# Patient Record
Sex: Male | Born: 1965
Health system: Southern US, Community
[De-identification: ages and names within clinical notes are randomized; demographics above are authoritative.]

## PROBLEM LIST (undated history)

## (undated) DIAGNOSIS — I4891 Unspecified atrial fibrillation: Secondary | ICD-10-CM

## (undated) HISTORY — DX: Unspecified atrial fibrillation: I48.91

---
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
## 1990-12-25 HISTORY — PX: HERNIA REPAIR: SHX51

## 2000-09-10 ENCOUNTER — Emergency Department (HOSPITAL_COMMUNITY): Admission: EM | Admit: 2000-09-10 | Discharge: 2000-09-11 | Payer: Self-pay

## 2006-03-28 ENCOUNTER — Emergency Department (HOSPITAL_COMMUNITY): Admission: EM | Admit: 2006-03-28 | Discharge: 2006-03-29 | Payer: Self-pay | Admitting: Emergency Medicine

## 2009-09-11 ENCOUNTER — Emergency Department (HOSPITAL_COMMUNITY): Admission: EM | Admit: 2009-09-11 | Discharge: 2009-09-11 | Payer: Self-pay | Admitting: Emergency Medicine

## 2010-06-24 ENCOUNTER — Emergency Department (HOSPITAL_COMMUNITY): Admission: EM | Admit: 2010-06-24 | Discharge: 2010-06-24 | Payer: Self-pay | Admitting: Emergency Medicine

## 2010-12-13 ENCOUNTER — Ambulatory Visit (HOSPITAL_COMMUNITY)
Admission: RE | Admit: 2010-12-13 | Discharge: 2010-12-13 | Payer: Self-pay | Source: Home / Self Care | Attending: Occupational Medicine | Admitting: Occupational Medicine

## 2011-06-28 ENCOUNTER — Emergency Department (HOSPITAL_COMMUNITY)
Admission: EM | Admit: 2011-06-28 | Discharge: 2011-06-28 | Disposition: A | Discharge status: Home / Self Care | Payer: Self-pay | Attending: Emergency Medicine | Admitting: Emergency Medicine

## 2011-06-28 DIAGNOSIS — K047 Periapical abscess without sinus: Secondary | ICD-10-CM | POA: Insufficient documentation

## 2011-06-28 DIAGNOSIS — K089 Disorder of teeth and supporting structures, unspecified: Secondary | ICD-10-CM | POA: Insufficient documentation

## 2011-06-28 DIAGNOSIS — K006 Disturbances in tooth eruption: Secondary | ICD-10-CM | POA: Insufficient documentation

## 2012-05-10 ENCOUNTER — Emergency Department (INDEPENDENT_AMBULATORY_CARE_PROVIDER_SITE_OTHER)
Admission: EM | Admit: 2012-05-10 | Discharge: 2012-05-10 | Disposition: A | Discharge status: Home / Self Care | Payer: Self-pay | Source: Home / Self Care | Attending: Family Medicine | Admitting: Family Medicine

## 2012-05-10 ENCOUNTER — Encounter (HOSPITAL_COMMUNITY): Payer: Self-pay

## 2012-05-10 ENCOUNTER — Emergency Department (INDEPENDENT_AMBULATORY_CARE_PROVIDER_SITE_OTHER): Payer: Self-pay

## 2012-05-10 DIAGNOSIS — M25572 Pain in left ankle and joints of left foot: Secondary | ICD-10-CM

## 2012-05-10 DIAGNOSIS — M25579 Pain in unspecified ankle and joints of unspecified foot: Secondary | ICD-10-CM

## 2012-05-10 MED ORDER — IBUPROFEN 600 MG PO TABS: 600.0000 mg | ORAL_TABLET | Freq: Three times a day (TID) | ORAL | Status: AC

## 2012-05-10 MED ORDER — TRAMADOL HCL 50 MG PO TABS: 50.0000 mg | ORAL_TABLET | Freq: Four times a day (QID) | ORAL | Status: AC | PRN

## 2012-05-10 NOTE — Discharge Instructions (Signed)
There is no evidence of bone injury or fractures in your x-rays.  Take the prescribed medications as instructed be aware that tramadol can make you drowsy and he should not drive after taking this medication. Start rehabilitation exercises as soon as pain improves follow handout start slowly and increase up to 3 times daily at least 10 repetitions each exercise. Followup with the orthopedic Dr. (number provided above) if worsening symptoms or if persistent pain after completed treatment.

## 2012-05-10 NOTE — ED Notes (Signed)
Denies injury; has been having pain in left lateral  ankle for several weeks, tender to palpation; no reported relief w motrin 800mg 

## 2012-05-10 NOTE — ED Notes (Signed)
Vitals obtained by [place].

## 2012-05-13 NOTE — ED Provider Notes (Signed)
History     CSN: [id number]  Arrival date & time 05/10/12  1954   First MD Initiated Contact with Patient 05/10/12 1959      Chief Complaint  Patient presents with  . Ankle Pain    (Consider location/radiation/quality/duration/timing/severity/associated sxs/prior treatment) HPI Comments: 46 y/o obese male comes c/o left ankle pain for 4 weeks. States he sprained his ankle about 1 month ago while walking. Did not seek medical attention as appeared minimal injury with no swelling or bruising, but pain has persisted and worsened with time. 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 Reports pain with walking with limping and with touching the area also reports pain while laying in bed.    History reviewed. No pertinent past medical history.  History reviewed. No pertinent past surgical history.  History reviewed. No pertinent family history.  History  Substance Use Topics  . Smoking status: Not on file  . Smokeless tobacco: Not on file  . Alcohol Use: Not on file      Review of Systems  Constitutional:       10 systems reviewed: pertinent negative and positive symptoms as per HPI.     All other systems reviewed and are negative.    Allergies  Review of patient's allergies indicates not on file.  Home Medications   Current Outpatient Rx  Name Route Sig Dispense Refill  . IBUPROFEN 600 MG PO TABS Oral Take 1 tablet (600 mg total) by mouth 3 (three) times daily. 20 tablet 0  . TRAMADOL HCL 50 MG PO TABS Oral Take 1 tablet (50 mg total) by mouth every 6 (six) hours as needed for pain. 15 tablet 0    BP 100/69  Pulse 75  Temp(Src) 98.4 F (36.9 C) (Oral)  Resp 14  SpO2 98%  Physical Exam  Nursing note and vitals reviewed. Constitutional: He is oriented to person, place, and time. He appears well-developed and well-nourished. No distress.       Large individual also obese.   HENT:  Head: Normocephalic and atraumatic.    Neck: No JVD present. No thyromegaly present.  Cardiovascular: Normal heart sounds.   Pulmonary/Chest: Breath sounds normal.  Musculoskeletal:       Left ankle: Large bilateral ankles. No obvious swelling, redness or deformity. Tenderness to palpation inferior and anterior to lateral malleoli. No pitting edema. No bruising. No laxity. Also reported tenderness by palpating heel pad.  Patient able to bear weight in left foot but reported discomfort. Intact sensation , dorsal pedi and tibial posterior pulses.  Neurological: He is alert and oriented to person, place, and time.  Skin: No rash noted.    ED Course  Procedures (including critical care time)  Labs Reviewed - No data to display No results found.   1. Ankle pain, left       MDM  No abnormal findings on Xrays. Treated symptomatically with ankle brace. Ibuprofen and tramadol. Follow up with orthopedist as needed.        Sharin Grave, MD 05/13/12 1334

## 2012-11-13 ENCOUNTER — Ambulatory Visit (INDEPENDENT_AMBULATORY_CARE_PROVIDER_SITE_OTHER): Payer: BC Managed Care – PPO | Admitting: Emergency Medicine

## 2012-11-13 VITALS — BP 106/76 | HR 73 | Temp 98.2°F | Resp 16 | Ht 73.0 in | Wt 343.0 lb

## 2012-11-13 DIAGNOSIS — Z0289 Encounter for other administrative examinations: Secondary | ICD-10-CM

## 2012-11-13 DIAGNOSIS — Z0000 Encounter for general adult medical examination without abnormal findings: Secondary | ICD-10-CM

## 2012-11-13 DIAGNOSIS — E669 Obesity, unspecified: Secondary | ICD-10-CM

## 2012-11-13 LAB — POCT UA - MICROSCOPIC ONLY
Bacteria, U Microscopic: NEGATIVE
RBC, urine, microscopic: NEGATIVE
WBC, Ur, HPF, POC: NEGATIVE

## 2012-11-13 LAB — POCT URINALYSIS DIPSTICK
Bilirubin, UA: NEGATIVE
Glucose, UA: NEGATIVE
Leukocytes, UA: NEGATIVE
Nitrite, UA: NEGATIVE

## 2012-11-13 NOTE — Progress Notes (Signed)
[facility] [address] [place] [address] [phone]  Date:  11/13/2012   Name:  Shane Hart   DOB:  11-04-1966   MRN:  [id number]  PCP:  No primary provider on file.    Chief Complaint: Annual Exam   History of Present Illness:  Shane Hart is a 46 y.o. very pleasant male patient who presents with the following:  Requests a general physical.  Is not fasting.  Has no current healtcare concerns.  No medications.  No complaints.  Current immunizations.  Denies other complaint or health concern today.   There is no problem list on file for this patient.   History reviewed. No pertinent past medical history.  History reviewed. No pertinent past surgical history.  History  Substance Use Topics  . Smoking status: Never Smoker   . Smokeless tobacco: Not on file  . Alcohol Use: No    Family History  Problem Relation Age of Onset  . Diabetes Mother     No Known Allergies  Medication list has been reviewed and updated.  No current outpatient prescriptions on file prior to visit.    Review of Systems:  As per HPI, otherwise negative.    Physical Examination: Filed Vitals:   11/13/12 1234  BP: 106/76  Pulse: 73  Temp: 98.2 F (36.8 C)  Resp: 16   Filed Vitals:   11/13/12 1234  Height: 6\' 1"  (1.854 m)  Weight: 343 lb (155.584 kg)   Body mass index is 45.25 kg/(m^2). Ideal Body Weight: Weight in (lb) to have BMI = 25: 189.1   GEN: obese, NAD, Non-toxic, A & O x 3  No shortness of breath, sepsis or icterus no rash. HEENT: Atraumatic, Normocephalic. Neck supple. No masses, No LAD.  Oropharynx negative Ears and Nose: No external deformity.  TM negative CV: RRR, No M/G/R. No JVD. No thrill. No extra heart sounds. PULM: CTA B, no wheezes, crackles, rhonchi. No retractions. No resp. distress. No accessory muscle use. ABD: S, NT, ND, +BS. No rebound. No HSM. EXTR: No c/c/e NEURO Normal gait.  PSYCH: Normally interactive.  Conversant. Not depressed or anxious appearing.  Calm demeanor.    Assessment and Plan: Preventive health exam Labs Follow up after labs Recommended weight loss  Carmelina Dane, MD

## 2012-11-14 ENCOUNTER — Encounter (INDEPENDENT_AMBULATORY_CARE_PROVIDER_SITE_OTHER): Payer: BC Managed Care – PPO | Admitting: Emergency Medicine

## 2012-11-14 VITALS — BP 121/73 | HR 62

## 2012-11-14 DIAGNOSIS — Z0289 Encounter for other administrative examinations: Secondary | ICD-10-CM

## 2012-11-14 DIAGNOSIS — Z0000 Encounter for general adult medical examination without abnormal findings: Secondary | ICD-10-CM

## 2012-11-14 DIAGNOSIS — E669 Obesity, unspecified: Secondary | ICD-10-CM

## 2012-11-14 LAB — POCT CBC
HCT, POC: 42.8 % — AB (ref 43.5–53.7)
Lymph, poc: 2.1 (ref 0.6–3.4)
MCHC: 30.4 g/dL — AB (ref 31.8–35.4)
MID (cbc): 0.2 (ref 0–0.9)
MPV: 8.3 fL (ref 0–99.8)
POC Granulocyte: 2.7 (ref 2–6.9)
POC LYMPH PERCENT: 41.6 %L (ref 10–50)
POC MID %: 4.8 %M (ref 0–12)
RDW, POC: 12.9 %

## 2012-11-14 LAB — LIPID PANEL
LDL Cholesterol: 33 mg/dL (ref 0–99)
Total CHOL/HDL Ratio: 2.2 Ratio
VLDL: 19 mg/dL (ref 0–40)

## 2012-11-14 LAB — COMPREHENSIVE METABOLIC PANEL
Alkaline Phosphatase: 62 U/L (ref 39–117)
Glucose, Bld: 97 mg/dL (ref 70–99)
Sodium: 136 mEq/L (ref 135–145)
Total Bilirubin: 0.8 mg/dL (ref 0.3–1.2)
Total Protein: 7.2 g/dL (ref 6.0–8.3)

## 2012-11-14 LAB — TSH: TSH: 1.292 u[IU]/mL (ref 0.350–4.500)

## 2012-11-14 LAB — PSA: PSA: 1.29 ng/mL (ref <=4.00)

## 2012-11-15 LAB — VITAMIN D 25 HYDROXY (VIT D DEFICIENCY, FRACTURES): Vit D, 25-Hydroxy: 26 ng/mL — ABNORMAL LOW (ref 30–89)

## 2012-11-15 MED ORDER — ERGOCALCIFEROL 1.25 MG (50000 UT) PO CAPS: 50000.0000 [IU] | ORAL_CAPSULE | ORAL | Status: DC

## 2012-11-24 NOTE — Progress Notes (Signed)
Reviewed and agree.

## 2013-01-03 ENCOUNTER — Emergency Department (INDEPENDENT_AMBULATORY_CARE_PROVIDER_SITE_OTHER)
Admission: EM | Admit: 2013-01-03 | Discharge: 2013-01-03 | Disposition: A | Discharge status: Home / Self Care | Payer: BC Managed Care – PPO | Source: Home / Self Care | Attending: Family Medicine | Admitting: Family Medicine

## 2013-01-03 ENCOUNTER — Encounter (HOSPITAL_COMMUNITY): Payer: Self-pay | Admitting: *Deleted

## 2013-01-03 DIAGNOSIS — J329 Chronic sinusitis, unspecified: Secondary | ICD-10-CM

## 2013-01-03 MED ORDER — AZITHROMYCIN 250 MG PO TABS: ORAL_TABLET | ORAL | Status: DC

## 2013-01-03 MED ORDER — SALINE NASAL SPRAY 0.65 % NA SOLN: 1.0000 | NASAL | Status: DC | PRN

## 2013-01-03 NOTE — ED Provider Notes (Signed)
History     CSN: [id number]  Arrival date & time 01/03/13  1344   First MD Initiated Contact with Patient 01/03/13 1520      Chief Complaint  Patient presents with  . Facial Pain    (Consider location/radiation/quality/duration/timing/severity/associated sxs/prior treatment) Patient is a 47 y.o. male presenting with URI. The history is provided by the patient.  URI The primary symptoms include headaches. The current episode started 6 to 7 days ago. This is a new problem. The problem has not changed since onset. The onset of the illness is associated with exposure to sick contacts. Symptoms associated with the illness include facial pain, sinus pressure, congestion and rhinorrhea. The following treatments were addressed: Acetaminophen was effective (Mucinex).  Reports blood tinged nasal discharge  History reviewed. No pertinent past medical history.  History reviewed. No pertinent past surgical history.  Family History  Problem Relation Age of Onset  . Diabetes Mother     History  Substance Use Topics  . Smoking status: Never Smoker   . Smokeless tobacco: Not on file  . Alcohol Use: No      Review of Systems  HENT: Positive for congestion, rhinorrhea and sinus pressure.   Neurological: Positive for headaches.  All other systems reviewed and are negative.    Allergies  Review of patient's allergies indicates no known allergies.  Home Medications   Current Outpatient Rx  Name  Route  Sig  Dispense  Refill  . AZITHROMYCIN 250 MG PO TABS      Azithromycin 500mg  on day 1, then 250mg  on days 2-4   6 tablet   0   . ERGOCALCIFEROL 50000 UNITS PO CAPS   Oral   Take 1 capsule (50,000 Units total) by mouth once a week.   4 capsule   12   . SALINE NASAL SPRAY 0.65 % NA SOLN   Nasal   Place 1 spray into the nose as needed for congestion.   30 mL   12     BP 119/79  Pulse 67  Temp 97.8 F (36.6 C) (Oral)  Resp 22  SpO2 99%  Physical Exam  Nursing note  and vitals reviewed. Constitutional: He is oriented to person, place, and time. Vital signs are normal. He appears well-developed and well-nourished. He is active and cooperative.  HENT:  Head: Normocephalic.  Right Ear: External ear normal. A middle ear effusion is present.  Left Ear: Tympanic membrane and external ear normal.  Nose: Right sinus exhibits maxillary sinus tenderness and frontal sinus tenderness. Left sinus exhibits maxillary sinus tenderness and frontal sinus tenderness.  Mouth/Throat: Uvula is midline, oropharynx is clear and moist and mucous membranes are normal.  Eyes: Conjunctivae normal are normal. Pupils are equal, round, and reactive to light. No scleral icterus.  Neck: Trachea normal and normal range of motion. Neck supple.  Cardiovascular: Normal rate, regular rhythm, normal heart sounds, intact distal pulses and normal pulses.   Pulmonary/Chest: Effort normal and breath sounds normal.  Lymphadenopathy:       Head (right side): No submental, no submandibular, no tonsillar, no preauricular, no posterior auricular and no occipital adenopathy present.       Head (left side): No submental, no submandibular, no tonsillar, no preauricular, no posterior auricular and no occipital adenopathy present.    He has no cervical adenopathy.  Neurological: He is alert and oriented to person, place, and time. No cranial nerve deficit or sensory deficit.  Skin: Skin is warm and dry.  Psychiatric:  He has a normal mood and affect. His speech is normal and behavior is normal. Judgment and thought content normal. Cognition and memory are normal.    ED Course  Procedures (including critical care time)  Labs Reviewed - No data to display No results found.   1. Sinusitis       MDM  Increase fluid intake, rest.  Begin Azithromycin.  Begin saline nasal spray and/or saline irrigation. Antihistamines of your choice (Claritin or Zyrtec).  Tylenol or Motrin for fever/discomfort.  Followup  with PCP if not improving 5 to 7 days.         Johnsie Kindred, NP 01/03/13 1534

## 2013-01-03 NOTE — ED Notes (Signed)
Pt reports facial pain and congestion with drainage for the past 6 days with no relief from otc meds.. Denies fever ,body aches, or other symptoms.

## 2013-01-10 NOTE — ED Provider Notes (Signed)
Medical screening examination/treatment/procedure(s) were performed by resident physician or non-physician practitioner and as supervising physician I was immediately available for consultation/collaboration.   Jeran Hiltz DOUGLAS MD.    Jaritza Duignan D Monish Haliburton, MD 01/10/13 1759 

## 2013-03-30 ENCOUNTER — Ambulatory Visit (INDEPENDENT_AMBULATORY_CARE_PROVIDER_SITE_OTHER): Payer: BC Managed Care – PPO | Admitting: Physician Assistant

## 2013-03-30 VITALS — BP 106/72 | HR 58 | Temp 97.9°F | Resp 16 | Ht 73.0 in | Wt 341.8 lb

## 2013-03-30 DIAGNOSIS — J309 Allergic rhinitis, unspecified: Secondary | ICD-10-CM

## 2013-03-30 DIAGNOSIS — J329 Chronic sinusitis, unspecified: Secondary | ICD-10-CM

## 2013-03-30 MED ORDER — AMOXICILLIN-POT CLAVULANATE 875-125 MG PO TABS: 1.0000 | ORAL_TABLET | Freq: Two times a day (BID) | ORAL | Status: DC

## 2013-03-30 MED ORDER — FLUTICASONE PROPIONATE 50 MCG/ACT NA SUSP: 2.0000 | Freq: Every day | NASAL | Status: DC

## 2013-03-30 NOTE — Progress Notes (Signed)
  Subjective:    Patient ID: Shane Hart, male    DOB: 01-07-1966, 47 y.o.   MRN: [id number]  HPI   Shane Hart is a pleasant 47 yr old male here with concern for sinusitis.  States he is feeling a lot of sinus pressure, nasal drainage that is blood tinged.  This has been present for about 5 days.  OTC meds not helping.  Also complains of itchy watery eyes.  Endorse facial pain and upper tooth pain.  Denies env allergies.  Denies sore throat, ear pain, cough.  Denies fever, chills, body aches, muscle aches.  Denies sick contacts.    Review of Systems  Constitutional: Negative for fever and chills.  HENT: Positive for congestion, rhinorrhea and sinus pressure. Negative for ear pain and sore throat.   Respiratory: Negative for cough, shortness of breath and wheezing.   Cardiovascular: Negative.   Gastrointestinal: Negative.   Musculoskeletal: Negative.   Neurological: Negative.        Objective:   Physical Exam  Vitals reviewed. Constitutional: He is oriented to person, place, and time. He appears well-developed and well-nourished. No distress.  HENT:  Head: Normocephalic and atraumatic.  Right Ear: Tympanic membrane and ear canal normal.  Left Ear: Tympanic membrane and ear canal normal.  Nose: Mucosal edema and rhinorrhea present. Right sinus exhibits maxillary sinus tenderness. Right sinus exhibits no frontal sinus tenderness. Left sinus exhibits maxillary sinus tenderness. Left sinus exhibits no frontal sinus tenderness.  Mouth/Throat: Uvula is midline, oropharynx is clear and moist and mucous membranes are normal.  Neck: Neck supple.  Cardiovascular: Normal rate, regular rhythm and normal heart sounds.  Exam reveals no gallop and no friction rub.   No murmur heard. Pulmonary/Chest: Effort normal and breath sounds normal. He has no wheezes. He has no rales.  Lymphadenopathy:    He has no cervical adenopathy.  Neurological: He is alert and oriented to person, place, and time.  Skin:  Skin is warm and dry.  Psychiatric: He has a normal mood and affect. His behavior is normal.     Filed Vitals:   03/30/13 1125  BP: 106/72  Pulse: 58  Temp: 97.9 F (36.6 C)  Resp: 16        Assessment & Plan:  Sinusitis - Plan: fluticasone (FLONASE) 50 MCG/ACT nasal spray, amoxicillin-clavulanate (AUGMENTIN) 875-125 MG per tablet  -- Pt with 5 days of facial pain/pressure, upper tooth pain.  Maxillary sinus tenderness on exam.  I think it's reasonable to treat with abx.  Will start amox/clav x 10 days.  Encouraged pt to finish full course.  Push fluids.    Allergic rhinitis - Plan: fluticasone (FLONASE) 50 MCG/ACT nasal spray  -- I think there may be an allergic component to his symptoms.  Will start Flonase and otc antihistamine.  Discussed with pt that getting control of allergies may prevent the development of sinus infections in the future.  Discussed RTC precautions, pt understands and is in agreement with this plan.

## 2013-03-30 NOTE — Patient Instructions (Addendum)
Begin taking the antibiotic as directed.  Be sure to finish the full course.  Take with food to reduce stomach upset.  Plenty of fluids (water is best!)  Begin using the Flonase nasal spray, this will help with nasal congestion and help control allergy symptoms.  Also begin taking an allergy medicine by mouth (Claritin/Allegra/Zyrtec) - this will also help with nasal symptoms and itchy eyes.  If any symptoms are worsening or not improving, please let us know.   Sinusitis Sinusitis is redness, soreness, and swelling (inflammation) of the paranasal sinuses. Paranasal sinuses are air pockets within the bones of your face (beneath the eyes, the middle of the forehead, or above the eyes). In healthy paranasal sinuses, mucus is able to drain out, and air is able to circulate through them by way of your nose. However, when your paranasal sinuses are inflamed, mucus and air can become trapped. This can allow bacteria and other germs to grow and cause infection. Sinusitis can develop quickly and last only a short time (acute) or continue over a long period (chronic). Sinusitis that lasts for more than 12 weeks is considered chronic.  CAUSES  Causes of sinusitis include:  Allergies.  Structural abnormalities, such as displacement of the cartilage that separates your nostrils (deviated septum), which can decrease the air flow through your nose and sinuses and affect sinus drainage.  Functional abnormalities, such as when the small hairs (cilia) that line your sinuses and help remove mucus do not work properly or are not present. SYMPTOMS  Symptoms of acute and chronic sinusitis are the same. The primary symptoms are pain and pressure around the affected sinuses. Other symptoms include:  Upper toothache.  Earache.  Headache.  Bad breath.  Decreased sense of smell and taste.  A cough, which worsens when you are lying flat.  Fatigue.  Fever.  Thick drainage from your nose, which often is green and  may contain pus (purulent).  Swelling and warmth over the affected sinuses. DIAGNOSIS  Your caregiver will perform a physical exam. During the exam, your caregiver may:  Look in your nose for signs of abnormal growths in your nostrils (nasal polyps).  Tap over the affected sinus to check for signs of infection.  View the inside of your sinuses (endoscopy) with a special imaging device with a light attached (endoscope), which is inserted into your sinuses. If your caregiver suspects that you have chronic sinusitis, one or more of the following tests may be recommended:  Allergy tests.  Nasal culture A sample of mucus is taken from your nose and sent to a lab and screened for bacteria.  Nasal cytology A sample of mucus is taken from your nose and examined by your caregiver to determine if your sinusitis is related to an allergy. TREATMENT  Most cases of acute sinusitis are related to a viral infection and will resolve on their own within 10 days. Sometimes medicines are prescribed to help relieve symptoms (pain medicine, decongestants, nasal steroid sprays, or saline sprays).  However, for sinusitis related to a bacterial infection, your caregiver will prescribe antibiotic medicines. These are medicines that will help kill the bacteria causing the infection.  Rarely, sinusitis is caused by a fungal infection. In theses cases, your caregiver will prescribe antifungal medicine. For some cases of chronic sinusitis, surgery is needed. Generally, these are cases in which sinusitis recurs more than 3 times per year, despite other treatments. HOME CARE INSTRUCTIONS   Drink plenty of water. Water helps thin the mucus  so your sinuses can drain more easily.  Use a humidifier.  Inhale steam 3 to 4 times a day (for example, sit in the bathroom with the shower running).  Apply a warm, moist washcloth to your face 3 to 4 times a day, or as directed by your caregiver.  Use saline nasal sprays to help  moisten and clean your sinuses.  Take over-the-counter or prescription medicines for pain, discomfort, or fever only as directed by your caregiver. SEEK IMMEDIATE MEDICAL CARE IF:  You have increasing pain or severe headaches.  You have nausea, vomiting, or drowsiness.  You have swelling around your face.  You have vision problems.  You have a stiff neck.  You have difficulty breathing. MAKE SURE YOU:   Understand these instructions.  Will watch your condition.  Will get help right away if you are not doing well or get worse. Document Released: 12/11/2005 Document Revised: 03/04/2012 Document Reviewed: 12/26/2011 [facility] Patient Information 2013 [place], [place].

## 2013-03-31 ENCOUNTER — Telehealth: Payer: Self-pay

## 2013-03-31 ENCOUNTER — Ambulatory Visit (INDEPENDENT_AMBULATORY_CARE_PROVIDER_SITE_OTHER): Payer: BC Managed Care – PPO | Admitting: Emergency Medicine

## 2013-03-31 VITALS — BP 101/70 | HR 53 | Temp 97.7°F | Resp 18 | Ht 73.0 in | Wt 343.0 lb

## 2013-03-31 DIAGNOSIS — J018 Other acute sinusitis: Secondary | ICD-10-CM

## 2013-03-31 MED ORDER — HYDROCODONE-ACETAMINOPHEN 5-325 MG PO TABS: 1.0000 | ORAL_TABLET | ORAL | Status: DC | PRN

## 2013-03-31 MED ORDER — PSEUDOEPHEDRINE-GUAIFENESIN ER 60-600 MG PO TB12: 1.0000 | ORAL_TABLET | Freq: Two times a day (BID) | ORAL | Status: DC

## 2013-03-31 NOTE — Telephone Encounter (Signed)
Traditionally, ibuprofen &/or acetaminophen is the recommended treatment for the HA associated with sinusitis.

## 2013-03-31 NOTE — Progress Notes (Signed)
[facility] [address] [place] [address] [phone]  Date:  03/31/2013   Name:  Shane Hart   DOB:  May 17, 1966   MRN:  [id number]  PCP:  No primary provider on file.    Chief Complaint: Headache and Sinusitis   History of Present Illness:  Shane Hart is a 47 y.o. very pleasant male patient who presents with the following:  Ill past six days with sinus complaints.  Seen yesterday and put on augmentin and flonase.  Now returns today with increased pain that has not been relieved by course of medications.  Denies fever or chills.  Has purulent nasal drainage that is frequently blood tinged.  No nausea or vomiting.  No cough, wheezing or shortness of breath.  No improvement with over the counter medications or other home remedies. Denies other complaint or health concern today.   There is no problem list on file for this patient.   History reviewed. No pertinent past medical history.  History reviewed. No pertinent past surgical history.  History  Substance Use Topics  . Smoking status: Never Smoker   . Smokeless tobacco: Not on file  . Alcohol Use: No    Family History  Problem Relation Age of Onset  . Hypertension Mother   . Hypertension Sister     No Known Allergies  Medication list has been reviewed and updated.  Current Outpatient Prescriptions on File Prior to Visit  Medication Sig Dispense Refill  . amoxicillin-clavulanate (AUGMENTIN) 875-125 MG per tablet Take 1 tablet by mouth 2 (two) times daily.  20 tablet  0  . fluticasone (FLONASE) 50 MCG/ACT nasal spray Place 2 sprays into the nose daily.  16 g  3   No current facility-administered medications on file prior to visit.    Review of Systems:  As per HPI, otherwise negative.    Physical Examination: Filed Vitals:   03/31/13 1522  BP: 101/70  Pulse: 53  Temp: 97.7 F (36.5 C)  Resp: 18   Filed Vitals:   03/31/13 1522  Height: 6\' 1"  (1.854 m)  Weight: 343 lb  (155.584 kg)   Body mass index is 45.26 kg/(m^2). Ideal Body Weight: Weight in (lb) to have BMI = 25: 189.1  GEN: obese, NAD, Non-toxic, A & O x 3 HEENT: Atraumatic, Normocephalic. Neck supple. No masses, No LAD. Ears and Nose: No external deformity.  Tender maxillary sinuses bilaterally CV: RRR, No M/G/R. No JVD. No thrill. No extra heart sounds. PULM: CTA B, no wheezes, crackles, rhonchi. No retractions. No resp. distress. No accessory muscle use. ABD: S, NT, ND, +BS. No rebound. No HSM. EXTR: No c/c/e NEURO Normal gait.  PSYCH: Normally interactive. Conversant. Not depressed or anxious appearing.  Calm demeanor.    Assessment and Plan: Sinusitis mucinex d  vicodin Follow up as needed   Signed,  Phillips Odor, MD

## 2013-03-31 NOTE — Patient Instructions (Signed)

## 2013-03-31 NOTE — Telephone Encounter (Signed)
Patient was in yesterday for a sinus infection would like to know if he could have pain medication for the headaches he is getting from the infection please call him at [phone]

## 2013-03-31 NOTE — Telephone Encounter (Signed)
He states ibuprofen no relief, he has come back in b/c headache is worse.

## 2013-04-10 ENCOUNTER — Encounter (HOSPITAL_COMMUNITY): Payer: Self-pay | Admitting: Emergency Medicine

## 2013-04-10 ENCOUNTER — Emergency Department (HOSPITAL_COMMUNITY)
Admission: EM | Admit: 2013-04-10 | Discharge: 2013-04-10 | Disposition: A | Discharge status: Home / Self Care | Payer: BC Managed Care – PPO | Attending: Emergency Medicine | Admitting: Emergency Medicine

## 2013-04-10 ENCOUNTER — Emergency Department (HOSPITAL_COMMUNITY): Payer: BC Managed Care – PPO

## 2013-04-10 DIAGNOSIS — R0789 Other chest pain: Secondary | ICD-10-CM | POA: Insufficient documentation

## 2013-04-10 DIAGNOSIS — J329 Chronic sinusitis, unspecified: Secondary | ICD-10-CM | POA: Insufficient documentation

## 2013-04-10 DIAGNOSIS — J9801 Acute bronchospasm: Secondary | ICD-10-CM

## 2013-04-10 DIAGNOSIS — Z79899 Other long term (current) drug therapy: Secondary | ICD-10-CM | POA: Insufficient documentation

## 2013-04-10 DIAGNOSIS — R093 Abnormal sputum: Secondary | ICD-10-CM | POA: Insufficient documentation

## 2013-04-10 DIAGNOSIS — R0602 Shortness of breath: Secondary | ICD-10-CM | POA: Insufficient documentation

## 2013-04-10 DIAGNOSIS — J209 Acute bronchitis, unspecified: Secondary | ICD-10-CM | POA: Insufficient documentation

## 2013-04-10 DIAGNOSIS — J3489 Other specified disorders of nose and nasal sinuses: Secondary | ICD-10-CM | POA: Insufficient documentation

## 2013-04-10 DIAGNOSIS — J40 Bronchitis, not specified as acute or chronic: Secondary | ICD-10-CM

## 2013-04-10 MED ORDER — HYDROCODONE-ACETAMINOPHEN 5-325 MG PO TABS: 1.0000 | ORAL_TABLET | ORAL | Status: DC | PRN

## 2013-04-10 MED ORDER — ALBUTEROL SULFATE (5 MG/ML) 0.5% IN NEBU
5.0000 mg | INHALATION_SOLUTION | Freq: Once | RESPIRATORY_TRACT | Status: AC
  Administered 2013-04-10: 5 mg via RESPIRATORY_TRACT
  Filled 2013-04-10: qty 1

## 2013-04-10 MED ORDER — OXYCODONE-ACETAMINOPHEN 5-325 MG PO TABS
2.0000 | ORAL_TABLET | Freq: Once | ORAL | Status: AC
  Administered 2013-04-10: 2 via ORAL
  Filled 2013-04-10 (×2): qty 1

## 2013-04-10 MED ORDER — GUAIFENESIN-CODEINE 100-10 MG/5ML PO SYRP: 5.0000 mL | ORAL_SOLUTION | Freq: Three times a day (TID) | ORAL | Status: DC | PRN

## 2013-04-10 MED ORDER — IPRATROPIUM BROMIDE 0.02 % IN SOLN
0.5000 mg | Freq: Once | RESPIRATORY_TRACT | Status: AC
  Administered 2013-04-10: 0.5 mg via RESPIRATORY_TRACT
  Filled 2013-04-10: qty 2.5

## 2013-04-10 MED ORDER — ALBUTEROL SULFATE HFA 108 (90 BASE) MCG/ACT IN AERS
2.0000 | INHALATION_SPRAY | Freq: Four times a day (QID) | RESPIRATORY_TRACT | Status: DC
  Administered 2013-04-10: 2 via RESPIRATORY_TRACT
  Filled 2013-04-10: qty 6.7

## 2013-04-10 MED ORDER — LEVOFLOXACIN 500 MG PO TABS: 500.0000 mg | ORAL_TABLET | Freq: Every day | ORAL | Status: DC

## 2013-04-10 NOTE — ED Notes (Signed)
Pt's family states he has been coughing and having burning in his chest with cough  Pt states he has been to the dr this week and was put on antibiotics on Monday  Pt states he is not feeling better

## 2013-04-10 NOTE — ED Provider Notes (Signed)
History     CSN: [id number]  Arrival date & time 04/10/13  7829   First MD Initiated Contact with Patient 04/10/13 2048      Chief Complaint  Patient presents with  . Cough  . Shortness of Breath    (Consider location/radiation/quality/duration/timing/severity/associated sxs/prior treatment) HPI Comments: 47 year old male with no significant past medical history presents to the emergency department with his wife complaining of worsening cough and congestion x 2 weeks. Patient was seen at [facility] on 4/6 and prescribed flonase and augmentin for a sinus infection. He returned the next day due to increasing sinus pain and was given vicodin. Symptoms have not subsided and he is now experiencing chest tightness and burning with a cough with occasional production of green phlegm. Admits to associated cold sweats and chills. He completed the course of augmentin and has been using nasal spray. Admits to occasional sob and wheezing. No hx of asthma. Denies chest pain, nausea or vomiting. No sick contacts.  Patient is a 47 y.o. male presenting with cough and shortness of breath. The history is provided by the patient and the spouse.  Cough Associated symptoms: shortness of breath   Shortness of Breath Associated symptoms: cough     History reviewed. No pertinent past medical history.  Past Surgical History  Procedure Laterality Date  . Hernia repair    . Knee surgery      Family History  Problem Relation Age of Onset  . Hypertension Mother   . Hypertension Sister     History  Substance Use Topics  . Smoking status: Never Smoker   . Smokeless tobacco: Not on file  . Alcohol Use: No      Review of Systems  Respiratory: Positive for cough and shortness of breath.     Allergies  Review of patient's allergies indicates no known allergies.  Home Medications   Current Outpatient Rx  Name  Route  Sig  Dispense  Refill  . fluticasone (FLONASE) 50 MCG/ACT nasal  spray   Nasal   Place 2 sprays into the nose daily as needed for allergies.         Marland Kitchen HYDROcodone-acetaminophen (NORCO/VICODIN) 5-325 MG per tablet   Oral   Take 1 tablet by mouth every 4 (four) hours as needed for pain.         Marland Kitchen loratadine-pseudoephedrine (CLARITIN-D 24-HOUR) 10-240 MG per 24 hr tablet   Oral   Take 1 tablet by mouth daily.         Marland Kitchen amoxicillin-clavulanate (AUGMENTIN) 875-125 MG per tablet   Oral   Take 1 tablet by mouth 2 (two) times daily.           BP 128/69  Pulse 83  Temp(Src) 99.4 F (37.4 C) (Oral)  Resp 19  SpO2 97%  Physical Exam  Nursing note and vitals reviewed. Constitutional: He is oriented to person, place, and time. He appears well-developed. No distress.  Obese.  HENT:  Head: Normocephalic and atraumatic.  Nose: Mucosal edema present. Right sinus exhibits maxillary sinus tenderness and frontal sinus tenderness. Left sinus exhibits maxillary sinus tenderness and frontal sinus tenderness.  Mouth/Throat: Uvula is midline and mucous membranes are normal. Posterior oropharyngeal erythema present. No oropharyngeal exudate or posterior oropharyngeal edema.  Eyes: Conjunctivae are normal.  Neck: Normal range of motion. Neck supple.  Cardiovascular: Normal rate, regular rhythm and normal heart sounds.   Pulmonary/Chest: Effort normal. No accessory muscle usage. No respiratory distress. He has no decreased breath sounds.  He has wheezes (scattered end-expiratory). He has rhonchi (mild scattered). He has no rales.  Abdominal: Soft. Bowel sounds are normal. There is no tenderness.  Musculoskeletal: Normal range of motion. He exhibits no edema.  Lymphadenopathy:    He has no cervical adenopathy.  Neurological: He is alert and oriented to person, place, and time.  Skin: Skin is warm and dry.  Psychiatric: He has a normal mood and affect. His behavior is normal.    ED Course  Procedures (including critical care time)  Labs Reviewed - No  data to display Dg Chest 2 View  04/10/2013  *RADIOLOGY REPORT*  Clinical Data: Cough and shortness of breath.  CHEST - 2 VIEW  Comparison: Chest radiograph performed 03/28/2006  Findings: The lungs are well-aerated.  Mild vascular congestion is noted, with mildly increased interstitial markings; this could reflect minimal interstitial edema.  Mild pneumonia could conceivably have a similar appearance.  Small bilateral pleural effusions are noted.  No pneumothorax is seen.  The heart is normal in size; the mediastinal contour is within normal limits.  No acute osseous abnormalities are seen.  IMPRESSION: Mild vascular congestion, with mildly increased interstitial markings; this could reflect minimal interstitial edema.  Mild pneumonia could conceivably have a similar appearance.   Original Report Authenticated By: Tonia Ghent, M.D.      1. Bronchitis   2. Bronchospasm   3. Sinusitis       MDM  47 y/o male with worsening cough since being treated for sinus infection. End-expiratory wheezes heard on exam. Will give duo-neb. Marked mucosal edema present along with mild sinus tenderness. 9:48 PM CXR showing possible mild pneumonia with vascular congestion. Patient reports some improvement in symptoms with duo-neb. Wheezing with slight clinical improvement on re-examination. Rx levaquin, albuterol inhaler, guaifenesin for cough, and vicodin for pain. Return precautions discussed. Patient and wife state understanding of plan and are agreeable.        Trevor Mace, PA-C 04/10/13 2150

## 2013-04-10 NOTE — ED Provider Notes (Signed)
Medical screening examination/treatment/procedure(s) were performed by non-physician practitioner and as supervising physician I was immediately available for consultation/collaboration.  Kealy Lewter R. Jasmond River, MD 04/10/13 2336 

## 2013-06-05 NOTE — Progress Notes (Signed)
This encounter was created in error - please disregard.

## 2013-07-11 ENCOUNTER — Ambulatory Visit (INDEPENDENT_AMBULATORY_CARE_PROVIDER_SITE_OTHER): Payer: BC Managed Care – PPO | Admitting: Emergency Medicine

## 2013-07-11 VITALS — BP 132/82 | HR 91 | Temp 98.0°F | Resp 17 | Ht 72.0 in | Wt 332.0 lb

## 2013-07-11 DIAGNOSIS — J018 Other acute sinusitis: Secondary | ICD-10-CM

## 2013-07-11 DIAGNOSIS — J019 Acute sinusitis, unspecified: Secondary | ICD-10-CM

## 2013-07-11 MED ORDER — PSEUDOEPHEDRINE-GUAIFENESIN ER 60-600 MG PO TB12: 1.0000 | ORAL_TABLET | Freq: Two times a day (BID) | ORAL | Status: AC

## 2013-07-11 MED ORDER — AMOXICILLIN-POT CLAVULANATE 875-125 MG PO TABS: 1.0000 | ORAL_TABLET | Freq: Two times a day (BID) | ORAL | Status: DC

## 2013-07-11 NOTE — Patient Instructions (Addendum)

## 2013-07-11 NOTE — Progress Notes (Signed)
  Subjective:     Shane Hart is a 47 y.o. male who presents for evaluation of sinus pain. Symptoms include: congestion, facial pain, frequent clearing of the throat, headaches, nasal congestion, post nasal drip and sinus pressure. Onset of symptoms was 5 days ago. Symptoms have been gradually worsening since that time. Past history is significant for no history of pneumonia or bronchitis. Patient is a non-smoker.  The following portions of the patient's history were reviewed and updated as appropriate: allergies, current medications, past family history, past medical history, past social history, past surgical history and problem list.  Review of Systems A comprehensive review of systems was negative.   Objective:    BP 132/82  Pulse 91  Temp(Src) 98 F (36.7 C) (Oral)  Resp 17  Ht 6' (1.829 m)  Wt 332 lb (150.594 kg)  BMI 45.02 kg/m2  SpO2 97% General appearance: alert, cooperative and no distress Head: Normocephalic, without obvious abnormality, atraumatic, sinuses tender to percussion Eyes: conjunctivae/corneas clear. PERRL, EOM's intact. Fundi benign. Ears: normal TM's and external ear canals both ears Nose: Nares normal. Septum midline. Mucosa normal. No drainage or sinus tenderness. Throat: lips, mucosa, and tongue normal; teeth and gums normal Lungs: clear to auscultation bilaterally Extremities: extremities normal, atraumatic, no cyanosis or edema    Assessment:    Acute bacterial sinusitis.    Plan:   augmentin mucinex d

## 2014-07-16 ENCOUNTER — Ambulatory Visit (INDEPENDENT_AMBULATORY_CARE_PROVIDER_SITE_OTHER): Payer: BC Managed Care – PPO | Admitting: Family Medicine

## 2014-07-16 VITALS — BP 106/62 | HR 78 | Temp 98.6°F | Resp 16 | Ht 72.0 in | Wt 334.0 lb

## 2014-07-16 DIAGNOSIS — H109 Unspecified conjunctivitis: Secondary | ICD-10-CM

## 2014-07-16 DIAGNOSIS — H5712 Ocular pain, left eye: Secondary | ICD-10-CM

## 2014-07-16 DIAGNOSIS — H5710 Ocular pain, unspecified eye: Secondary | ICD-10-CM

## 2014-07-16 MED ORDER — OFLOXACIN 0.3 % OP SOLN: OPHTHALMIC | Status: DC

## 2014-07-16 NOTE — Progress Notes (Signed)
Subjective: Patient was sitting on his couch at home it about 5 PM today, or 4 hours ago, when he got acute pain in his left eye. He felt like he had something in it. Knows of no getting in there, although he was doing some yard work earlier in the day. He tried to flush it out a little bit, then came on over to get it checked since it is continued to have discomfort. He does not wear glasses or contacts. 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  Objective: Eyes are PERRLA. Fundi appear benign. Lids looks clear. No evidence of bleeding. The eye was anesthetized with Propacaine.  When the lead was everted there was a little red spot underneath the left upper lid. No foreign bodies. Fluorescein staining did not reveal any abrasions.  Assessment: Pain Conjunctivitis  Plan: Ofloxacin drops If worse reassess and/or referral to ophthalmologist.

## 2014-07-16 NOTE — Patient Instructions (Signed)
Use 1 or 2 drops in the left eye every 4 hours for the next few days, then 4 times daily until you are doing well for a couple of days.  Return at any time if severely worse. Especially if you feel like it is getting a lot worse tomorrow call so we can try and get you in with an eye Dr.

## 2014-12-20 ENCOUNTER — Encounter (HOSPITAL_COMMUNITY): Payer: Self-pay | Admitting: *Deleted

## 2014-12-20 ENCOUNTER — Emergency Department (HOSPITAL_COMMUNITY)
Admission: EM | Admit: 2014-12-20 | Discharge: 2014-12-20 | Disposition: A | Discharge status: Home / Self Care | Payer: BC Managed Care – PPO | Source: Home / Self Care | Attending: Family Medicine | Admitting: Family Medicine

## 2014-12-20 DIAGNOSIS — R0981 Nasal congestion: Secondary | ICD-10-CM

## 2014-12-20 DIAGNOSIS — J069 Acute upper respiratory infection, unspecified: Secondary | ICD-10-CM

## 2014-12-20 MED ORDER — IPRATROPIUM BROMIDE 0.06 % NA SOLN: 2.0000 | Freq: Four times a day (QID) | NASAL | Status: DC

## 2014-12-20 NOTE — ED Notes (Signed)
C/o sinus pain over his R eye onset 12/25.  C/o stuffy nose, taking Tylenol sinus every 6 hrs with some relief.  No fever.  Has R earache and scratchy throat.

## 2014-12-20 NOTE — ED Provider Notes (Signed)
CSN: [id number]     Arrival date & time 12/20/14  [id number] History   First MD Initiated Contact with Patient 12/20/14 [phone]     Chief Complaint  Patient presents with  . Facial Pain   (Consider location/radiation/quality/duration/timing/severity/associated sxs/prior Treatment) HPI Comments: Nonsmoker Works as Chartered loss adjustermail truck driver PCP: [facility]  Patient is a 48 y.o. male presenting with URI. The history is provided by the patient.  URI Presenting symptoms: congestion and rhinorrhea   Presenting symptoms: no ear pain and no fever   Presenting symptoms comment:  +scratchy throat Severity:  Mild Onset quality:  Gradual Duration:  4 days Timing:  Constant Progression:  Unchanged Chronicity:  New Associated symptoms: headaches   Associated symptoms: no arthralgias, no myalgias, no neck pain, no sinus pain, no sneezing, no swollen glands and no wheezing   Associated symptoms comment:  +sinus pressure   History reviewed. No pertinent past medical history. Past Surgical History  Procedure Laterality Date  . Knee surgery Left 1979  . Hernia repair  1992    L groin   Family History  Problem Relation Age of Onset  . Hypertension Mother   . Hypertension Sister    History  Substance Use Topics  . Smoking status: Never Smoker   . Smokeless tobacco: Not on file  . Alcohol Use: No    Review of Systems  Constitutional: Negative for fever and chills.  HENT: Positive for congestion, rhinorrhea and sinus pressure. Negative for ear discharge, ear pain, mouth sores, nosebleeds and sneezing.   Eyes: Negative.   Respiratory: Negative.  Negative for wheezing.   Cardiovascular: Negative.   Musculoskeletal: Negative for myalgias, arthralgias and neck pain.  Neurological: Positive for headaches.    Allergies  Review of patient's allergies indicates no known allergies.  Home Medications   Prior to Admission medications   Medication Sig Start Date End Date Taking? Authorizing Provider   ipratropium (ATROVENT) 0.06 % nasal spray Place 2 sprays into both nostrils 4 (four) times daily. 12/20/14   Shane FareJennifer Lee H Jeni Duling, PA  ofloxacin (OCUFLOX) 0.3 % ophthalmic solution Use 1 or 2 drops in the left eye every 3-4 hours for the first 2 days, then decrease to 4 times daily 07/16/14   Peyton Najjaravid H Hopper, MD   BP 154/93 mmHg  Pulse 65  Temp(Src) 99.2 F (37.3 C) (Oral)  Resp 16  SpO2 97% Physical Exam  Constitutional: He is oriented to person, place, and time. He appears well-developed and well-nourished. No distress.  HENT:  Head: Normocephalic and atraumatic.  Right Ear: Hearing, tympanic membrane, external ear and ear canal normal.  Left Ear: Hearing, tympanic membrane, external ear and ear canal normal.  Nose: Nose normal.  Mouth/Throat: Uvula is midline, oropharynx is clear and moist and mucous membranes are normal.  Eyes: Conjunctivae are normal. Right eye exhibits no discharge. Left eye exhibits no discharge. No scleral icterus.  Neck: Normal range of motion. Neck supple.  Cardiovascular: Normal rate, regular rhythm and normal heart sounds.   Pulmonary/Chest: Effort normal and breath sounds normal.  Musculoskeletal: Normal range of motion.  Lymphadenopathy:    He has no cervical adenopathy.  Neurological: He is alert and oriented to person, place, and time.  Skin: Skin is warm and dry.  Psychiatric: He has a normal mood and affect. His behavior is normal.  Nursing note and vitals reviewed.   ED Course  Procedures (including critical care time) Labs Review Labs Reviewed - No data to display  Imaging Review No  results found.   MDM   1. Sinus congestion   2. URI (upper respiratory infection)    No clinical indication of acute bacterial sinusitis. Exam benign and consistent with minor common cold with expected sinus congestion and rhinorrhea With treat with Atrovent nasal spray Advised regarding expected course of illness Follow up with PCP if symptom do not  improve over next 5-7 days.   Shane ClockJennifer Lee H Satara Virella, GeorgiaPA 12/20/14 1018

## 2014-12-20 NOTE — Discharge Instructions (Signed)
May also consider using NetiPot to help relieve sinus congestion.  Upper Respiratory Infection, Adult An upper respiratory infection (URI) is also sometimes known as the common cold. The upper respiratory tract includes the nose, sinuses, throat, trachea, and bronchi. Bronchi are the airways leading to the lungs. Most people improve within 1 week, but symptoms can last up to 2 weeks. A residual cough may last even longer.  CAUSES Many different viruses can infect the tissues lining the upper respiratory tract. The tissues become irritated and inflamed and often become very moist. Mucus production is also common. A cold is contagious. You can easily spread the virus to others by oral contact. This includes kissing, sharing a glass, coughing, or sneezing. Touching your mouth or nose and then touching a surface, which is then touched by another person, can also spread the virus. SYMPTOMS  Symptoms typically develop 1 to 3 days after you come in contact with a cold virus. Symptoms vary from person to person. They may include:  Runny nose.  Sneezing.  Nasal congestion.  Sinus irritation.  Sore throat.  Loss of voice (laryngitis).  Cough.  Fatigue.  Muscle aches.  Loss of appetite.  Headache.  Low-grade fever. DIAGNOSIS  You might diagnose your own cold based on familiar symptoms, since most people get a cold 2 to 3 times a year. Your caregiver can confirm this based on your exam. Most importantly, your caregiver can check that your symptoms are not due to another disease such as strep throat, sinusitis, pneumonia, asthma, or epiglottitis. Blood tests, throat tests, and X-rays are not necessary to diagnose a common cold, but they may sometimes be helpful in excluding other more serious diseases. Your caregiver will decide if any further tests are required. RISKS AND COMPLICATIONS  You may be at risk for a more severe case of the common cold if you smoke cigarettes, have chronic heart  disease (such as heart failure) or lung disease (such as asthma), or if you have a weakened immune system. The very young and very old are also at risk for more serious infections. Bacterial sinusitis, middle ear infections, and bacterial pneumonia can complicate the common cold. The common cold can worsen asthma and chronic obstructive pulmonary disease (COPD). Sometimes, these complications can require emergency medical care and may be life-threatening. PREVENTION  The best way to protect against getting a cold is to practice good hygiene. Avoid oral or hand contact with people with cold symptoms. Wash your hands often if contact occurs. There is no clear evidence that vitamin C, vitamin E, echinacea, or exercise reduces the chance of developing a cold. However, it is always recommended to get plenty of rest and practice good nutrition. TREATMENT  Treatment is directed at relieving symptoms. There is no cure. Antibiotics are not effective, because the infection is caused by a virus, not by bacteria. Treatment may include:  Increased fluid intake. Sports drinks offer valuable electrolytes, sugars, and fluids.  Breathing heated mist or steam (vaporizer or shower).  Eating chicken soup or other clear broths, and maintaining good nutrition.  Getting plenty of rest.  Using gargles or lozenges for comfort.  Controlling fevers with ibuprofen or acetaminophen as directed by your caregiver.  Increasing usage of your inhaler if you have asthma. Zinc gel and zinc lozenges, taken in the first 24 hours of the common cold, can shorten the duration and lessen the severity of symptoms. Pain medicines may help with fever, muscle aches, and throat pain. A variety of non-prescription  medicines are available to treat congestion and runny nose. Your caregiver can make recommendations and may suggest nasal or lung inhalers for other symptoms.  HOME CARE INSTRUCTIONS   Only take over-the-counter or prescription  medicines for pain, discomfort, or fever as directed by your caregiver.  Use a warm mist humidifier or inhale steam from a shower to increase air moisture. This may keep secretions moist and make it easier to breathe.  Drink enough water and fluids to keep your urine clear or pale yellow.  Rest as needed.  Return to work when your temperature has returned to normal or as your caregiver advises. You may need to stay home longer to avoid infecting others. You can also use a face mask and careful hand washing to prevent spread of the virus. SEEK MEDICAL CARE IF:   After the first few days, you feel you are getting worse rather than better.  You need your caregiver's advice about medicines to control symptoms.  You develop chills, worsening shortness of breath, or brown or red sputum. These may be signs of pneumonia.  You develop yellow or brown nasal discharge or pain in the face, especially when you bend forward. These may be signs of sinusitis.  You develop a fever, swollen neck glands, pain with swallowing, or white areas in the back of your throat. These may be signs of strep throat. SEEK IMMEDIATE MEDICAL CARE IF:   You have a fever.  You develop severe or persistent headache, ear pain, sinus pain, or chest pain.  You develop wheezing, a prolonged cough, cough up blood, or have a change in your usual mucus (if you have chronic lung disease).  You develop sore muscles or a stiff neck. Document Released: 06/06/2001 Document Revised: 03/04/2012 Document Reviewed: 03/18/2014 [facility] Patient Information 2015 [place], [place]. This information is not intended to replace advice given to you by your health care provider. Make sure you discuss any questions you have with your health care provider.

## 2015-03-04 ENCOUNTER — Encounter (HOSPITAL_COMMUNITY): Payer: Self-pay

## 2015-03-04 ENCOUNTER — Emergency Department (HOSPITAL_COMMUNITY)
Admission: EM | Admit: 2015-03-04 | Discharge: 2015-03-04 | Disposition: A | Discharge status: Home / Self Care | Payer: BLUE CROSS/BLUE SHIELD | Attending: Emergency Medicine | Admitting: Emergency Medicine

## 2015-03-04 DIAGNOSIS — M545 Low back pain, unspecified: Secondary | ICD-10-CM

## 2015-03-04 DIAGNOSIS — Z7951 Long term (current) use of inhaled steroids: Secondary | ICD-10-CM | POA: Diagnosis not present

## 2015-03-04 DIAGNOSIS — Z9889 Other specified postprocedural states: Secondary | ICD-10-CM | POA: Insufficient documentation

## 2015-03-04 DIAGNOSIS — Z79899 Other long term (current) drug therapy: Secondary | ICD-10-CM | POA: Insufficient documentation

## 2015-03-04 MED ORDER — NAPROXEN 500 MG PO TABS: 500.0000 mg | ORAL_TABLET | Freq: Two times a day (BID) | ORAL | Status: DC

## 2015-03-04 MED ORDER — METHOCARBAMOL 500 MG PO TABS: 500.0000 mg | ORAL_TABLET | Freq: Two times a day (BID) | ORAL | Status: DC | PRN

## 2015-03-04 NOTE — ED Notes (Signed)
Pt presents with c/o mid back pain that started approx 4 days ago. Pt denies any injury. Pt reports no urinary symptoms.

## 2015-03-04 NOTE — ED Provider Notes (Signed)
CSN: [id number]     Arrival date & time 03/04/15  1827 History   First MD Initiated Contact with Patient 03/04/15 1953    This chart was scribed for non-physician practitioner, Everlene FarrierWilliam Sayward Horvath, PA, working with No att. providers found by Marica OtterNusrat Rahman, ED Scribe. This patient was seen in room WTR6/WTR6 and the patient's care was started at 7:55 PM.  Chief Complaint  Patient presents with  . Back Pain   The history is provided by the patient. No language interpreter was used.   PCP: No PCP Per Patient HPI Comments: Shane Hart is a 49 y.o. male, with PMH noted below, who presents to the Emergency Department complaining of atraumatic, sudden onset, constant, left sided, sharp mid back pain onset 4 days ago. Pt rates his current pain a 9 out of 10. Pt reports that movement and laying on his side makes the pain worse. Pt denies taking any measures at home to alleviate his Sx.   Pt denies urinary/bowel incontinence, dysuria, blood in urine, trouble ambulating, trouble urinating, frequency, abd pain, nausea. Pt denies any Hx of GERD.   History reviewed. No pertinent past medical history. Past Surgical History  Procedure Laterality Date  . Knee surgery Left 1979  . Hernia repair  1992    L groin   Family History  Problem Relation Age of Onset  . Hypertension Mother   . Hypertension Sister    History  Substance Use Topics  . Smoking status: Never Smoker   . Smokeless tobacco: Not on file  . Alcohol Use: No    Review of Systems  Constitutional: Negative for fever and chills.  Gastrointestinal: Negative for abdominal pain.  Genitourinary: Negative for dysuria, urgency, frequency and difficulty urinating.  Musculoskeletal: Positive for back pain (left sided back pain). Negative for myalgias, joint swelling, gait problem, neck pain and neck stiffness.  Skin: Negative for rash and wound.  Neurological: Negative for weakness and numbness.  Psychiatric/Behavioral: Negative for confusion.    Allergies  Review of patient's allergies indicates no known allergies.  Home Medications   Prior to Admission medications   Medication Sig Start Date End Date Taking? Authorizing Provider  ipratropium (ATROVENT) 0.06 % nasal spray Place 2 sprays into both nostrils 4 (four) times daily. 12/20/14   Mathis FareJennifer Lee H Presson, PA  methocarbamol (ROBAXIN) 500 MG tablet Take 1 tablet (500 mg total) by mouth 2 (two) times daily as needed for muscle spasms. 03/04/15   Everlene FarrierWilliam Dorrie Cocuzza, PA-C  naproxen (NAPROSYN) 500 MG tablet Take 1 tablet (500 mg total) by mouth 2 (two) times daily with a meal. 03/04/15   Everlene FarrierWilliam Denina Rieger, PA-C  ofloxacin (OCUFLOX) 0.3 % ophthalmic solution Use 1 or 2 drops in the left eye every 3-4 hours for the first 2 days, then decrease to 4 times daily 07/16/14   Peyton Najjaravid H Hopper, MD   Triage Vitals: BP 118/77 mmHg  Pulse 81  Temp(Src) 98.5 F (36.9 C) (Oral)  Resp 18  SpO2 99% Physical Exam  Constitutional: He is oriented to person, place, and time. He appears well-developed and well-nourished. No distress.  HENT:  Head: Normocephalic and atraumatic.  Mouth/Throat: Oropharynx is clear and moist. No oropharyngeal exudate.  Eyes: Conjunctivae are normal. Pupils are equal, round, and reactive to light. Right eye exhibits no discharge. Left eye exhibits no discharge.  Neck: Neck supple. No tracheal deviation present.  Cardiovascular: Normal rate, regular rhythm, normal heart sounds and intact distal pulses.   Pulmonary/Chest: Effort normal and breath sounds  normal. No respiratory distress. He has no wheezes. He has no rales.  Abdominal: Soft. There is no tenderness.  Musculoskeletal: Normal range of motion. He exhibits no edema or tenderness.  No back tenderness. No midline tenderness, no step-offs, no deformity, no bony tenderness. The patient is able to ambulate without difficulty or assistance.  Lymphadenopathy:    He has no cervical adenopathy.  Neurological: He is alert and  oriented to person, place, and time. He has normal reflexes. Coordination normal.  Skin: Skin is warm and dry. No rash noted. He is not diaphoretic. No erythema. No pallor.  Psychiatric: He has a normal mood and affect. His behavior is normal.  Nursing note and vitals reviewed.   ED Course  Procedures (including critical care time) DIAGNOSTIC STUDIES: Oxygen Saturation is 99% on RA, nl by my interpretation.    COORDINATION OF CARE: 8:00 PM-Discussed treatment plan which includes meds and back exercises with pt at bedside and pt agreed to plan. Pt advised to f/u with PCP next week; pt advised to return to the ED if Sx worsen.   Labs Review Labs Reviewed - No data to display  Imaging Review No results found.   EKG Interpretation None      Filed Vitals:   03/04/15 1832  BP: 118/77  Pulse: 81  Temp: 98.5 F (36.9 C)  TempSrc: Oral  Resp: 18  SpO2: 99%     MDM   Meds given in ED:  Medications - No data to display  Discharge Medication List as of 03/04/2015  8:04 PM    START taking these medications   Details  methocarbamol (ROBAXIN) 500 MG tablet Take 1 tablet (500 mg total) by mouth 2 (two) times daily as needed for muscle spasms., Starting 03/04/2015, Until Discontinued, Print    naproxen (NAPROSYN) 500 MG tablet Take 1 tablet (500 mg total) by mouth 2 (two) times daily with a meal., Starting 03/04/2015, Until Discontinued, Print         Final diagnoses:  Left-sided low back pain without sciatica   Patient with back pain.  No neurological deficits and normal neuro exam.  Patient can walk but states is painful.  No loss of bowel or bladder control.  No concern for cauda equina.  No fever, night sweats, weight loss, h/o cancer, IVDU.  RICE protocol and pain medicine indicated and discussed with patient. I advised the patient to follow-up with their primary care provider this week. I advised the patient to return to the emergency department with new or worsening  symptoms or new concerns. The patient verbalized understanding and agreement with plan.   I personally performed the services described in this documentation, which was scribed in my presence. The recorded information has been reviewed and is accurate.   MDM Number of Diagnoses or Management Options Left-sided low back pain without sciatica:       Everlene Farrier, PA-C 03/04/15 2204  Mirian Mo, MD 03/08/15 [id number]

## 2015-03-04 NOTE — Discharge Instructions (Signed)
Back Pain, Adult °Low back pain is very common. About 1 in 5 people have back pain. The cause of low back pain is rarely dangerous. The pain often gets better over time. About half of people with a sudden onset of back pain feel better in just 2 weeks. About 8 in 10 people feel better by 6 weeks.  °CAUSES °Some common causes of back pain include: °· Strain of the muscles or ligaments supporting the spine. °· Wear and tear (degeneration) of the spinal discs. °· Arthritis. °· Direct injury to the back. °DIAGNOSIS °Most of the time, the direct cause of low back pain is not known. However, back pain can be treated effectively even when the exact cause of the pain is unknown. Answering your caregiver's questions about your overall health and symptoms is one of the most accurate ways to make sure the cause of your pain is not dangerous. If your caregiver needs more information, he or she may order lab work or imaging tests (X-rays or MRIs). However, even if imaging tests show changes in your back, this usually does not require surgery. °HOME CARE INSTRUCTIONS °For many people, back pain returns. Since low back pain is rarely dangerous, it is often a condition that people can learn to manage on their own.  °· Remain active. It is stressful on the back to sit or stand in one place. Do not sit, drive, or stand in one place for more than 30 minutes at a time. Take short walks on level surfaces as soon as pain allows. Try to increase the length of time you walk each day. °· Do not stay in bed. Resting more than 1 or 2 days can delay your recovery. °· Do not avoid exercise or work. Your body is made to move. It is not dangerous to be active, even though your back may hurt. Your back will likely heal faster if you return to being active before your pain is gone. °· Pay attention to your body when you  bend and lift. Many people have less discomfort when lifting if they bend their knees, keep the load close to their bodies, and  avoid twisting. Often, the most comfortable positions are those that put less stress on your recovering back. °· Find a comfortable position to sleep. Use a firm mattress and lie on your side with your knees slightly bent. If you lie on your back, put a pillow under your knees. °· Only take over-the-counter or prescription medicines as directed by your caregiver. Over-the-counter medicines to reduce pain and inflammation are often the most helpful. Your caregiver may prescribe muscle relaxant drugs. These medicines help dull your pain so you can more quickly return to your normal activities and healthy exercise. °· Put ice on the injured area. °· Put ice in a plastic bag. °· Place a towel between your skin and the bag. °· Leave the ice on for 15-20 minutes, 03-04 times a day for the first 2 to 3 days. After that, ice and heat may be alternated to reduce pain and spasms. °· Ask your caregiver about trying back exercises and gentle massage. This may be of some benefit. °· Avoid feeling anxious or stressed. Stress increases muscle tension and can worsen back pain. It is important to recognize when you are anxious or stressed and learn ways to manage it. Exercise is a great option. °SEEK MEDICAL CARE IF: °· You have pain that is not relieved with rest or medicine. °· You have pain that does not improve in 1 week. °· You have new symptoms. °· You are generally not feeling well. °SEEK   IMMEDIATE MEDICAL CARE IF:   You have pain that radiates from your back into your legs.  You develop new bowel or bladder control problems.  You have unusual weakness or numbness in your arms or legs.  You develop nausea or vomiting.  You develop abdominal pain.  You feel faint. Document Released: 12/11/2005 Document Revised: 06/11/2012 Document Reviewed: 04/14/2014 [facility] Patient Information 2015 [place], [place]. This information is not intended to replace advice given to you by your health care provider. Make sure you  discuss any questions you have with your health care provider.  Back Exercises Back exercises help treat and prevent back injuries. The goal of back exercises is to increase the strength of your abdominal and back muscles and the flexibility of your back. These exercises should be started when you no longer have back pain. Back exercises include:  Pelvic Tilt. Lie on your back with your knees bent. Tilt your pelvis until the lower part of your back is against the floor. Hold this position 5 to 10 sec and repeat 5 to 10 times.  Knee to Chest. Pull first 1 knee up against your chest and hold for 20 to 30 seconds, repeat this with the other knee, and then both knees. This may be done with the other leg straight or bent, whichever feels better.  Sit-Ups or Curl-Ups. Bend your knees 90 degrees. Start with tilting your pelvis, and do a partial, slow sit-up, lifting your trunk only 30 to 45 degrees off the floor. Take at least 2 to 3 seconds for each sit-up. Do not do sit-ups with your knees out straight. If partial sit-ups are difficult, simply do the above but with only tightening your abdominal muscles and holding it as directed.  Hip-Lift. Lie on your back with your knees flexed 90 degrees. Push down with your feet and shoulders as you raise your hips a couple inches off the floor; hold for 10 seconds, repeat 5 to 10 times.  Back arches. Lie on your stomach, propping yourself up on bent elbows. Slowly press on your hands, causing an arch in your low back. Repeat 3 to 5 times. Any initial stiffness and discomfort should lessen with repetition over time.  Shoulder-Lifts. Lie face down with arms beside your body. Keep hips and torso pressed to floor as you slowly lift your head and shoulders off the floor. Do not overdo your exercises, especially in the beginning. Exercises may cause you some mild back discomfort which lasts for a few minutes; however, if the pain is more severe, or lasts for more than 15  minutes, do not continue exercises until you see your caregiver. Improvement with exercise therapy for back problems is slow.  See your caregivers for assistance with developing a proper back exercise program. Document Released: 01/18/2005 Document Revised: 03/04/2012 Document Reviewed: 10/12/2011 [facility] Patient Information 2015 [place], [place]. This information is not intended to replace advice given to you by your health care provider. Make sure you discuss any questions you have with your health care provider.  Back Injury Prevention Back injuries can be extremely painful and difficult to heal. After having one back injury, you are much more likely to experience another later on. It is important to learn how to avoid injuring or re-injuring your back. The following tips can help you to prevent a back injury. PHYSICAL FITNESS  Exercise regularly and try to develop good tone in your abdominal muscles. Your abdominal muscles provide a lot of the support needed by your back.  Do aerobic exercises (walking, jogging, biking, swimming) regularly.  Do exercises that increase balance and strength (tai chi, yoga) regularly. This can decrease your risk of falling and injuring your back.  Stretch before and after exercising.  Maintain a healthy weight. The more you weigh, the more stress is placed on your back. For every pound of weight, 10 times that amount of pressure is placed on the back. DIET  Talk to your caregiver about how much calcium and vitamin D you need per day. These nutrients help to prevent weakening of the bones (osteoporosis). Osteoporosis can cause broken (fractured) bones that lead to back pain.  Include good sources of calcium in your diet, such as dairy products, green, leafy vegetables, and products with calcium added (fortified).  Include good sources of vitamin D in your diet, such as milk and foods that are fortified with vitamin D.  Consider taking a nutritional  supplement or a multivitamin if needed.  Stop smoking if you smoke. POSTURE  Sit and stand up straight. Avoid leaning forward when you sit or hunching over when you stand.  Choose chairs with good low back (lumbar) support.  If you work at a desk, sit close to your work so you do not need to lean over. Keep your chin tucked in. Keep your neck drawn back and elbows bent at a right angle. Your arms should look like the letter "L."  Sit high and close to the steering wheel when you drive. Add a lumbar support to your car seat if needed.  Avoid sitting or standing in one position for too long. Take breaks to get up, stretch, and walk around at least once every hour. Take breaks if you are driving for long periods of time.  Sleep on your side with your knees slightly bent, or sleep on your back with a pillow under your knees. Do not sleep on your stomach. LIFTING, TWISTING, AND REACHING  Avoid heavy lifting, especially repetitive lifting. If you must do heavy lifting:  Stretch before lifting.  Work slowly.  Rest between lifts.  Use carts and dollies to move objects when possible.  Make several small trips instead of carrying 1 heavy load.  Ask for help when you need it.  Ask for help when moving big, awkward objects.  Follow these steps when lifting:  Stand with your feet shoulder-width apart.  Get as close to the object as you can. Do not try to pick up heavy objects that are far from your body.  Use handles or lifting straps if they are available.  Bend at your knees. Squat down, but keep your heels off the floor.  Keep your shoulders pulled back, your chin tucked in, and your back straight.  Lift the object slowly, tightening the muscles in your legs, abdomen, and buttocks. Keep the object as close to the center of your body as possible.  When you put a load down, use these same guidelines in reverse.  Do not:  Lift the object above your waist.  Twist at the waist  while lifting or carrying a load. Move your feet if you need to turn, not your waist.  Bend over without bending at your knees.  Avoid reaching over your head, across a table, or for an object on a high surface. OTHER TIPS  Avoid wet floors and keep sidewalks clear of ice to prevent falls.  Do not sleep on a mattress that is too soft or too hard.  Keep items that are  used frequently within easy reach.  Put heavier objects on shelves at waist level and lighter objects on lower or higher shelves.  Find ways to decrease your stress, such as exercise, massage, or relaxation techniques. Stress can build up in your muscles. Tense muscles are more vulnerable to injury.  Seek treatment for depression or anxiety if needed. These conditions can increase your risk of developing back pain. SEEK MEDICAL CARE IF:  You injure your back.  You have questions about diet, exercise, or other ways to prevent back injuries. MAKE SURE YOU:  Understand these instructions.  Will watch your condition.  Will get help right away if you are not doing well or get worse. Document Released: 01/18/2005 Document Revised: 03/04/2012 Document Reviewed: 01/22/2012 [facility] Patient Information 2015 [place], [place]. This information is not intended to replace advice given to you by your health care provider. Make sure you discuss any questions you have with your health care provider.

## 2015-05-11 ENCOUNTER — Encounter (HOSPITAL_COMMUNITY): Payer: Self-pay | Admitting: Emergency Medicine

## 2015-05-11 ENCOUNTER — Emergency Department (HOSPITAL_COMMUNITY)
Admission: EM | Admit: 2015-05-11 | Discharge: 2015-05-11 | Disposition: A | Discharge status: Home / Self Care | Payer: BLUE CROSS/BLUE SHIELD | Source: Home / Self Care | Attending: Family Medicine | Admitting: Family Medicine

## 2015-05-11 DIAGNOSIS — J0110 Acute frontal sinusitis, unspecified: Secondary | ICD-10-CM | POA: Diagnosis not present

## 2015-05-11 MED ORDER — AMOXICILLIN-POT CLAVULANATE 875-125 MG PO TABS: 1.0000 | ORAL_TABLET | Freq: Two times a day (BID) | ORAL | Status: DC

## 2015-05-11 MED ORDER — IBUPROFEN 800 MG PO TABS: 800.0000 mg | ORAL_TABLET | Freq: Three times a day (TID) | ORAL | Status: DC

## 2015-05-11 NOTE — Discharge Instructions (Signed)

## 2015-05-11 NOTE — ED Provider Notes (Signed)
CSN: [id number]     Arrival date & time 05/11/15  1646 History   First MD Initiated Contact with Patient 05/11/15 1750     Chief Complaint  Patient presents with  . Sinusitis  . Migraine   (Consider location/radiation/quality/duration/timing/severity/associated sxs/prior Treatment) Patient is a 49 y.o. male presenting with sinusitis and migraines. The history is provided by the patient. No language interpreter was used.  Sinusitis Pain details:    Location:  Frontal and maxillary   Quality:  Aching   Severity:  Moderate   Duration:  4 days   Timing:  Constant Duration:  4 days Progression:  Worsening Chronicity:  New Context: not allergies   Relieved by:  Nothing Worsened by:  Nothing tried Ineffective treatments:  None tried Associated symptoms: congestion, cough and rhinorrhea   Associated symptoms: no shortness of breath   Migraine Pertinent negatives include no shortness of breath.    History reviewed. No pertinent past medical history. Past Surgical History  Procedure Laterality Date  . Knee surgery Left 1979  . Hernia repair  1992    L groin   Family History  Problem Relation Age of Onset  . Hypertension Mother   . Hypertension Sister    History  Substance Use Topics  . Smoking status: Never Smoker   . Smokeless tobacco: Not on file  . Alcohol Use: No    Review of Systems  HENT: Positive for congestion and rhinorrhea.   Respiratory: Positive for cough. Negative for shortness of breath.   All other systems reviewed and are negative.   Allergies  Review of patient's allergies indicates no known allergies.  Home Medications   Prior to Admission medications   Medication Sig Start Date End Date Taking? Authorizing Provider  ipratropium (ATROVENT) 0.06 % nasal spray Place 2 sprays into both nostrils 4 (four) times daily. 12/20/14   Mathis FareJennifer Lee H Presson, PA  methocarbamol (ROBAXIN) 500 MG tablet Take 1 tablet (500 mg total) by mouth 2 (two) times daily  as needed for muscle spasms. 03/04/15   Everlene FarrierWilliam Dansie, PA-C  naproxen (NAPROSYN) 500 MG tablet Take 1 tablet (500 mg total) by mouth 2 (two) times daily with a meal. 03/04/15   Everlene FarrierWilliam Dansie, PA-C  ofloxacin (OCUFLOX) 0.3 % ophthalmic solution Use 1 or 2 drops in the left eye every 3-4 hours for the first 2 days, then decrease to 4 times daily 07/16/14   Peyton Najjaravid H Hopper, MD   BP 116/78 mmHg  Pulse 57  Temp(Src) 98.8 F (37.1 C) (Oral)  Resp 20  SpO2 97% Physical Exam  Constitutional: He appears well-developed and well-nourished.  HENT:  Head: Normocephalic.  Right Ear: External ear normal.  Left Ear: External ear normal.  Mouth/Throat: Oropharynx is clear and moist. No oropharyngeal exudate.  Tender maxillary and frontal sinuses  Eyes: Conjunctivae are normal. Pupils are equal, round, and reactive to light.  Neck: Normal range of motion. Neck supple.  Cardiovascular: Normal rate.   Pulmonary/Chest: Effort normal.  Musculoskeletal: Normal range of motion.  Neurological: He is alert.  Skin: Skin is warm.  Psychiatric: He has a normal mood and affect.  Nursing note and vitals reviewed.   ED Course  Procedures (including critical care time) Labs Review Labs Reviewed - No data to display  Imaging Review No results found.   MDM   1. Acute frontal sinusitis, recurrence not specified    augmentin Ibuprofen Return if any problems.     Lonia SkinnerLeslie K FarleySofia, PA-C 05/11/15 Rickey [id number]

## 2015-05-11 NOTE — ED Notes (Signed)
Patient c/o sinus pressure and headaches x 4 days ago. Patient denies fever. Patient reports sensitivity to light. NAD.

## 2015-07-28 ENCOUNTER — Encounter (HOSPITAL_COMMUNITY): Payer: Self-pay | Admitting: *Deleted

## 2015-07-28 ENCOUNTER — Emergency Department (HOSPITAL_COMMUNITY)
Admission: EM | Admit: 2015-07-28 | Discharge: 2015-07-28 | Disposition: A | Discharge status: Home / Self Care | Payer: BLUE CROSS/BLUE SHIELD | Attending: Emergency Medicine | Admitting: Emergency Medicine

## 2015-07-28 DIAGNOSIS — R51 Headache: Secondary | ICD-10-CM | POA: Insufficient documentation

## 2015-07-28 DIAGNOSIS — R519 Headache, unspecified: Secondary | ICD-10-CM

## 2015-07-28 DIAGNOSIS — R220 Localized swelling, mass and lump, head: Secondary | ICD-10-CM | POA: Diagnosis present

## 2015-07-28 MED ORDER — DIPHENHYDRAMINE HCL 25 MG PO CAPS
25.0000 mg | ORAL_CAPSULE | Freq: Once | ORAL | Status: AC
  Administered 2015-07-28: 25 mg via ORAL
  Filled 2015-07-28: qty 1

## 2015-07-28 MED ORDER — KETOROLAC TROMETHAMINE 60 MG/2ML IM SOLN
60.0000 mg | Freq: Once | INTRAMUSCULAR | Status: AC
  Administered 2015-07-28: 60 mg via INTRAMUSCULAR
  Filled 2015-07-28: qty 2

## 2015-07-28 MED ORDER — METOCLOPRAMIDE HCL 10 MG PO TABS
10.0000 mg | ORAL_TABLET | Freq: Once | ORAL | Status: AC
  Administered 2015-07-28: 10 mg via ORAL
  Filled 2015-07-28: qty 1

## 2015-07-28 MED ORDER — METOCLOPRAMIDE HCL 10 MG PO TABS: 10.0000 mg | ORAL_TABLET | Freq: Three times a day (TID) | ORAL | Status: DC | PRN

## 2015-07-28 NOTE — ED Provider Notes (Signed)
CSN: [id number]     Arrival date & time 07/28/15  1857 History   First MD Initiated Contact with Patient 07/28/15 1953     Chief Complaint  Patient presents with  . Migraine  . Facial Pain     (Consider location/radiation/quality/duration/timing/severity/associated sxs/prior Treatment) HPI  Shane Hart is a 49 y.o. male with no second past medical history presenting today with headache. Patient states it's been going on for 2 days. It is bilateral located behind his eyes. He states it feels like a sinus headache. He states he gets his headache every time he has sinus congestion. He tried Tylenol Sinus over-the-counter medicine without any relief. He admits to congestion and a mildly productive cough. He's had nausea with no vomiting. He denies pain anywhere else in his body. He states his headache feels exactly similar to his prior headaches. Patient has no further complaints.   10 Systems reviewed and are negative for acute change except as noted in the HPI.    History reviewed. No pertinent past medical history. Past Surgical History  Procedure Laterality Date  . Knee surgery Left 1979  . Hernia repair  1992    L groin   Family History  Problem Relation Age of Onset  . Hypertension Mother   . Hypertension Sister    History  Substance Use Topics  . Smoking status: Never Smoker   . Smokeless tobacco: Not on file  . Alcohol Use: No    Review of Systems    Allergies  Review of patient's allergies indicates no known allergies.  Home Medications   Prior to Admission medications   Medication Sig Start Date End Date Taking? Authorizing Provider  pseudoephedrine-acetaminophen (TYLENOL SINUS) 30-500 MG TABS Take 2 tablets by mouth every 4 (four) hours as needed (migraine).   Yes Historical Provider, MD  amoxicillin-clavulanate (AUGMENTIN) 875-125 MG per tablet Take 1 tablet by mouth 2 (two) times daily. Patient not taking: Reported on 07/28/2015 05/11/15   Elson Areas, PA-C   ibuprofen (ADVIL,MOTRIN) 800 MG tablet Take 1 tablet (800 mg total) by mouth 3 (three) times daily. Patient not taking: Reported on 07/28/2015 05/11/15   Elson Areas, PA-C  ipratropium (ATROVENT) 0.06 % nasal spray Place 2 sprays into both nostrils 4 (four) times daily. Patient not taking: Reported on 07/28/2015 12/20/14   Mathis Fare Presson, PA  methocarbamol (ROBAXIN) 500 MG tablet Take 1 tablet (500 mg total) by mouth 2 (two) times daily as needed for muscle spasms. Patient not taking: Reported on 07/28/2015 03/04/15   Everlene Farrier, PA-C  naproxen (NAPROSYN) 500 MG tablet Take 1 tablet (500 mg total) by mouth 2 (two) times daily with a meal. Patient not taking: Reported on 07/28/2015 03/04/15   Everlene Farrier, PA-C  ofloxacin (OCUFLOX) 0.3 % ophthalmic solution Use 1 or 2 drops in the left eye every 3-4 hours for the first 2 days, then decrease to 4 times daily Patient not taking: Reported on 07/28/2015 07/16/14   Peyton Najjar, MD   BP 124/77 mmHg  Pulse 63  Temp(Src) 97.9 F (36.6 C) (Oral)  Resp 18  SpO2 100% Physical Exam  Constitutional: He is oriented to person, place, and time. Vital signs are normal. He appears well-developed and well-nourished.  Non-toxic appearance. He does not appear ill. No distress.  HENT:  Head: Normocephalic and atraumatic.  Nose: Nose normal.  Mouth/Throat: Oropharynx is clear and moist. No oropharyngeal exudate.  Eyes: Conjunctivae and EOM are normal. Pupils are equal, round,  and reactive to light. No scleral icterus.  Neck: Normal range of motion. Neck supple. No tracheal deviation, no edema, no erythema and normal range of motion present. No thyroid mass and no thyromegaly present.  Cardiovascular: Normal rate, regular rhythm, S1 normal, S2 normal, normal heart sounds, intact distal pulses and normal pulses.  Exam reveals no gallop and no friction rub.   No murmur heard. Pulses:      Radial pulses are 2+ on the right side, and 2+ on the left side.        Dorsalis pedis pulses are 2+ on the right side, and 2+ on the left side.  Pulmonary/Chest: Effort normal and breath sounds normal. No respiratory distress. He has no wheezes. He has no rhonchi. He has no rales.  Abdominal: Soft. Normal appearance and bowel sounds are normal. He exhibits no distension, no ascites and no mass. There is no hepatosplenomegaly. There is no tenderness. There is no rebound, no guarding and no CVA tenderness.  Musculoskeletal: Normal range of motion. He exhibits no edema or tenderness.  Lymphadenopathy:    He has no cervical adenopathy.  Neurological: He is alert and oriented to person, place, and time. He has normal strength. No cranial nerve deficit or sensory deficit.  Normal strength and sensation in all extremity. Normal cerebellar testing. Normal gait.  Skin: Skin is warm, dry and intact. No petechiae and no rash noted. He is not diaphoretic. No erythema. No pallor.  Psychiatric: He has a normal mood and affect. His behavior is normal. Judgment normal.  Nursing note and vitals reviewed.   ED Course  Procedures (including critical care time) Labs Review Labs Reviewed - No data to display  Imaging Review No results found.   EKG Interpretation None      MDM   Final diagnoses:  None    Patient presents to the emergency department for sinus headache. Patient states this is consistent with his prior headaches. I have low concern for meningitis or subarachnoid hemorrhage. The history is not consistent with those diagnoses. Patient was given Toradol, Reglan, Benadryl for pain relief.   Upon repeat evaluation, patient states his headache is significantly improved. He is requesting Reglan prescription to take at home as needed. This is provided for him. He was advised to see his primary care physician within 3 days for close follow-up. He appears well in no acute distress. His vital signs remain within his normal limits and is safe for discharge.  Tomasita Crumble, MD 07/28/15 2112

## 2015-07-28 NOTE — Discharge Instructions (Signed)
Sinus Headache Shane Hart, take reglan as needed for headache.  See your primary physician within 3 days for close follow up.  If symptoms worsen, come back to the ED immediately.  Thank you. A sinus headache happens when your sinuses become clogged or puffy (swollen). Sinus headaches can be mild or severe. HOME CARE  Take your medicines (antibiotics) as told. Finish them even if you start to feel better.  Only take medicine as told by your doctor.  Use a nose spray if you feel stuffed up (congested). GET HELP RIGHT AWAY IF:  You have a fever.  You have trouble seeing.  You suddenly have pain in your face or head.  You start to twitch or shake (seizure).  You are confused.  You get headaches more than once a week.  Light or sound bothers you.  You feel sick to your stomach (nauseous) or throw up (vomit).  Your headaches do not get better with treatment. MAKE SURE YOU:  Understand these instructions.  Will watch your condition.  Will get help right away if you are not doing well or get worse. Document Released: 04/12/2011 Document Revised: 03/04/2012 Document Reviewed: 04/12/2011 [facility] Patient Information 2015 [place], [place]. This information is not intended to replace advice given to you by your health care provider. Make sure you discuss any questions you have with your health care provider.

## 2015-07-28 NOTE — ED Notes (Addendum)
Pt complains of sinus pressure and migraine since Monday. Pt states the pain is around his frontal sinuses. Pt is sensitive to light

## 2015-11-06 ENCOUNTER — Encounter (HOSPITAL_COMMUNITY): Payer: Self-pay | Admitting: *Deleted

## 2015-11-06 ENCOUNTER — Emergency Department (HOSPITAL_COMMUNITY)
Admission: EM | Admit: 2015-11-06 | Discharge: 2015-11-06 | Disposition: A | Discharge status: Home / Self Care | Payer: BLUE CROSS/BLUE SHIELD | Source: Home / Self Care | Attending: Family Medicine | Admitting: Family Medicine

## 2015-11-06 DIAGNOSIS — J069 Acute upper respiratory infection, unspecified: Secondary | ICD-10-CM

## 2015-11-06 MED ORDER — MINOCYCLINE HCL 100 MG PO CAPS: 100.0000 mg | ORAL_CAPSULE | Freq: Two times a day (BID) | ORAL | Status: DC

## 2015-11-06 MED ORDER — IPRATROPIUM BROMIDE 0.06 % NA SOLN: 2.0000 | Freq: Four times a day (QID) | NASAL | Status: DC

## 2015-11-06 NOTE — ED Notes (Addendum)
Pt reports    Symptoms  Of  Facial pain /  Pressure         With     Blood           Tinged   Mucous     Pt  Reports  Symptoms  Not  releived  By  [facility]  meds

## 2015-11-06 NOTE — ED Provider Notes (Signed)
CSN: [id number]     Arrival date & time 11/06/15  1748 History   First MD Initiated Contact with Patient 11/06/15 1921     Chief Complaint  Patient presents with  . Facial Pain   (Consider location/radiation/quality/duration/timing/severity/associated sxs/prior Treatment) Patient is a 49 y.o. male presenting with URI. The history is provided by the patient.  URI Presenting symptoms: congestion and rhinorrhea   Presenting symptoms: no fever   Severity:  Moderate Onset quality:  Sudden Duration:  2 days Progression:  Unchanged Chronicity:  New Relieved by:  None tried Worsened by:  Nothing tried Ineffective treatments:  None tried   History reviewed. No pertinent past medical history. Past Surgical History  Procedure Laterality Date  . Knee surgery Left 1979  . Hernia repair  1992    L groin   Family History  Problem Relation Age of Onset  . Hypertension Mother   . Hypertension Sister    Social History  Substance Use Topics  . Smoking status: Never Smoker   . Smokeless tobacco: None  . Alcohol Use: No    Review of Systems  Constitutional: Negative.  Negative for fever.  HENT: Positive for congestion, postnasal drip and rhinorrhea.   All other systems reviewed and are negative.   Allergies  Review of patient's allergies indicates no known allergies.  Home Medications   Prior to Admission medications   Medication Sig Start Date End Date Taking? Authorizing Provider  ipratropium (ATROVENT) 0.06 % nasal spray Place 2 sprays into both nostrils 4 (four) times daily. 11/06/15   Linna HoffJames D Icis Budreau, MD  metoCLOPramide (REGLAN) 10 MG tablet Take 1 tablet (10 mg total) by mouth every 8 (eight) hours as needed (headache). 07/28/15   Tomasita CrumbleAdeleke Oni, MD  minocycline (MINOCIN,DYNACIN) 100 MG capsule Take 1 capsule (100 mg total) by mouth 2 (two) times daily. 11/06/15   Linna HoffJames D Issam Carlyon, MD  pseudoephedrine-acetaminophen (TYLENOL SINUS) 30-500 MG TABS Take 2 tablets by mouth every 4 (four)  hours as needed (migraine).    Historical Provider, MD   Meds Ordered and Administered this Visit  Medications - No data to display  BP 121/81 mmHg  Pulse 61  Temp(Src) 98.4 F (36.9 C) (Oral)  SpO2 98% No data found.   Physical Exam  Constitutional: He is oriented to person, place, and time. He appears well-developed and well-nourished.  HENT:  Right Ear: External ear normal.  Left Ear: External ear normal.  Nose: Nose normal.  Mouth/Throat: Oropharynx is clear and moist.  Eyes: Conjunctivae are normal. Pupils are equal, round, and reactive to light.  Neck: Normal range of motion. Neck supple.  Cardiovascular: Normal heart sounds.   Pulmonary/Chest: Effort normal and breath sounds normal.  Lymphadenopathy:    He has no cervical adenopathy.  Neurological: He is alert and oriented to person, place, and time.  Skin: Skin is warm and dry.  Nursing note and vitals reviewed.   ED Course  Procedures (including critical care time)  Labs Review Labs Reviewed - No data to display  Imaging Review No results found.   Visual Acuity Review  Right Eye Distance:   Left Eye Distance:   Bilateral Distance:    Right Eye Near:   Left Eye Near:    Bilateral Near:         MDM   1. URI (upper respiratory infection)        Linna HoffJames D Srah Ake, MD 11/06/15 [phone]

## 2015-11-06 NOTE — Discharge Instructions (Signed)
Drink plenty of fluids as discussed, use medicine as prescribed, and mucinex or delsym for cough. Return or see your doctor if further problems °

## 2016-03-16 ENCOUNTER — Encounter (HOSPITAL_COMMUNITY): Payer: Self-pay | Admitting: Emergency Medicine

## 2016-03-16 ENCOUNTER — Emergency Department (HOSPITAL_COMMUNITY): Payer: BLUE CROSS/BLUE SHIELD

## 2016-03-16 ENCOUNTER — Emergency Department (HOSPITAL_COMMUNITY)
Admission: EM | Admit: 2016-03-16 | Discharge: 2016-03-16 | Disposition: A | Discharge status: Home / Self Care | Payer: BLUE CROSS/BLUE SHIELD | Attending: Emergency Medicine | Admitting: Emergency Medicine

## 2016-03-16 DIAGNOSIS — S39012A Strain of muscle, fascia and tendon of lower back, initial encounter: Secondary | ICD-10-CM | POA: Insufficient documentation

## 2016-03-16 DIAGNOSIS — Z79899 Other long term (current) drug therapy: Secondary | ICD-10-CM | POA: Diagnosis not present

## 2016-03-16 DIAGNOSIS — Y998 Other external cause status: Secondary | ICD-10-CM | POA: Diagnosis not present

## 2016-03-16 DIAGNOSIS — Y9389 Activity, other specified: Secondary | ICD-10-CM | POA: Insufficient documentation

## 2016-03-16 DIAGNOSIS — Z792 Long term (current) use of antibiotics: Secondary | ICD-10-CM | POA: Insufficient documentation

## 2016-03-16 DIAGNOSIS — S3992XA Unspecified injury of lower back, initial encounter: Secondary | ICD-10-CM | POA: Diagnosis present

## 2016-03-16 DIAGNOSIS — Y92410 Unspecified street and highway as the place of occurrence of the external cause: Secondary | ICD-10-CM | POA: Diagnosis not present

## 2016-03-16 MED ORDER — OXYCODONE-ACETAMINOPHEN 5-325 MG PO TABS: 1.0000 | ORAL_TABLET | ORAL | Status: DC | PRN

## 2016-03-16 MED ORDER — KETOROLAC TROMETHAMINE 30 MG/ML IJ SOLN
30.0000 mg | Freq: Once | INTRAMUSCULAR | Status: AC
  Administered 2016-03-16: 30 mg via INTRAMUSCULAR
  Filled 2016-03-16: qty 1

## 2016-03-16 MED ORDER — OXYCODONE-ACETAMINOPHEN 5-325 MG PO TABS
1.0000 | ORAL_TABLET | Freq: Once | ORAL | Status: AC
  Administered 2016-03-16: 1 via ORAL
  Filled 2016-03-16: qty 1

## 2016-03-16 MED ORDER — DIAZEPAM 5 MG PO TABS: 5.0000 mg | ORAL_TABLET | Freq: Three times a day (TID) | ORAL | Status: DC | PRN

## 2016-03-16 MED ORDER — IBUPROFEN 600 MG PO TABS: 600.0000 mg | ORAL_TABLET | Freq: Four times a day (QID) | ORAL | Status: DC | PRN

## 2016-03-16 MED ORDER — DIAZEPAM 5 MG PO TABS
5.0000 mg | ORAL_TABLET | Freq: Once | ORAL | Status: AC
  Administered 2016-03-16: 5 mg via ORAL
  Filled 2016-03-16: qty 1
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
## 2016-09-04 ENCOUNTER — Ambulatory Visit (HOSPITAL_COMMUNITY)
Admission: EM | Admit: 2016-09-04 | Discharge: 2016-09-04 | Disposition: A | Discharge status: Home / Self Care | Payer: BLUE CROSS/BLUE SHIELD | Attending: Family Medicine | Admitting: Family Medicine

## 2016-09-04 ENCOUNTER — Ambulatory Visit (INDEPENDENT_AMBULATORY_CARE_PROVIDER_SITE_OTHER): Payer: BLUE CROSS/BLUE SHIELD

## 2016-09-04 ENCOUNTER — Encounter (HOSPITAL_COMMUNITY): Payer: Self-pay | Admitting: Emergency Medicine

## 2016-09-04 DIAGNOSIS — S82401A Unspecified fracture of shaft of right fibula, initial encounter for closed fracture: Secondary | ICD-10-CM

## 2016-09-04 DIAGNOSIS — S93401A Sprain of unspecified ligament of right ankle, initial encounter: Secondary | ICD-10-CM | POA: Diagnosis not present

## 2016-09-04 MED ORDER — HYDROCODONE-ACETAMINOPHEN 10-325 MG PO TABS: 1.0000 | ORAL_TABLET | Freq: Four times a day (QID) | ORAL | 0 refills | Status: DC | PRN
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
## 2016-09-04 NOTE — ED Provider Notes (Signed)
CSN: [id number]     Arrival date & time 09/04/16  1557 History   First MD Initiated Contact with Patient 09/04/16 1730     Chief Complaint  Patient presents with  . Ankle Pain   (Consider location/radiation/quality/duration/timing/severity/associated sxs/prior Treatment) HPI History obtained from patient:  Pt presents with the cc of:  Right ankle injury Duration of symptoms: A few hours Treatment prior to arrival: Ice and elevation Context: The patient was mowing grass stepped in a hole and twisted his right ankle unable to weight-bear now. Other symptoms include: Painful ambulation Pain score: 6 FAMILY HISTORY: Hypertension    History reviewed. No pertinent past medical history. Past Surgical History:  Procedure Laterality Date  . HERNIA REPAIR  1992   L groin  . KNEE SURGERY Left 1979   Family History  Problem Relation Age of Onset  . Hypertension Mother   . Hypertension Sister    Social History  Substance Use Topics  . Smoking status: Never Smoker  . Smokeless tobacco: Not on file  . Alcohol use No    Review of Systems  Denies: HEADACHE, NAUSEA, ABDOMINAL PAIN, CHEST PAIN, CONGESTION, DYSURIA, SHORTNESS OF BREATH  Allergies  Review of patient's allergies indicates no known allergies.  Home Medications   Prior to Admission medications   Medication Sig Start Date End Date Taking? Authorizing Provider  diazepam (VALIUM) 5 MG tablet Take 1 tablet (5 mg total) by mouth every 8 (eight) hours as needed for muscle spasms. 03/16/16   Tatyana Kirichenko, PA-C  ibuprofen (ADVIL,MOTRIN) 600 MG tablet Take 1 tablet (600 mg total) by mouth every 6 (six) hours as needed. 03/16/16   Tatyana Kirichenko, PA-C  OVER THE COUNTER MEDICATION Apply 1 application topically daily.    Historical Provider, MD  oxyCODONE-acetaminophen (PERCOCET) 5-325 MG tablet Take 1 tablet by mouth every 4 (four) hours as needed for severe pain. 03/16/16   Tatyana Kirichenko, PA-C   Meds Ordered and  Administered this Visit  Medications - No data to display  BP 135/91 (BP Location: Right Wrist)   Pulse 77   Temp 98.5 F (36.9 C) (Oral)   Resp 20   SpO2 97%  No data found.   Physical Exam NURSES NOTES AND VITAL SIGNS REVIEWED. CONSTITUTIONAL: Well developed, well nourished, no acute distress HEENT: normocephalic, atraumatic EYES: Conjunctiva normal NECK:normal ROM, supple, no adenopathy PULMONARY:No respiratory distress, normal effort ABDOMINAL: Soft, ND, NT BS+, No CVAT MUSCULOSKELETAL: Normal ROM of all extremities, Swelling tenderness, lateral ankle. Tender over atfl. Unable to weight bear.  SKIN: warm and dry without rash PSYCHIATRIC: Mood and affect, behavior are normal  Urgent Care Course   Clinical Course    Procedures (including critical care time)  Labs Review Labs Reviewed - No data to display  Imaging Review Dg Ankle Complete Right  Result Date: 09/04/2016 CLINICAL DATA:  Stepped in a hole today, rolled right ankle EXAM: RIGHT ANKLE - COMPLETE 3+ VIEW COMPARISON:  None. FINDINGS: Three views of the right ankle submitted. No displaced fracture or subluxation. Ankle mortise is preserved. There is soft tissue swelling adjacent to lateral malleolus. Question tiny avulsion fracture in distal right fibula. IMPRESSION: No displaced fracture or subluxation. Ankle mortise is preserved. There is lateral soft tissue swelling. Question tiny avulsion fracture in distal right fibula. Electronically Signed   By: Natasha Mead M.D.   On: 09/04/2016 18:01    Discussed with patient and wife prior to discharge. Visual Acuity Review  Right Eye Distance:   Left Eye  Distance:   Bilateral Distance:    Right Eye Near:   Left Eye Near:    Bilateral Near:         MDM   1. Fibula fracture, right, closed, initial encounter   2. Right ankle sprain, initial encounter     Patient is reassured that there are no issues that require transfer to higher level of care at this time  or additional tests. Patient is advised to continue home symptomatic treatment. Patient is advised that if there are new or worsening symptoms to attend the emergency department, contact primary care provider, or return to UC. Instructions of care provided discharged home in stable condition.    THIS NOTE WAS GENERATED USING A VOICE RECOGNITION SOFTWARE PROGRAM. ALL REASONABLE EFFORTS  WERE MADE TO PROOFREAD THIS DOCUMENT FOR ACCURACY.  I have verbally reviewed the discharge instructions with the patient. A printed AVS was given to the patient.  All questions were answered prior to discharge.      Tharon AquasFrank C Savalas Monje, PA 09/05/16 1223

## 2017-10-30 DIAGNOSIS — M545 Low back pain: Secondary | ICD-10-CM | POA: Diagnosis not present

## 2017-11-12 DIAGNOSIS — G4733 Obstructive sleep apnea (adult) (pediatric): Secondary | ICD-10-CM | POA: Diagnosis not present

## 2018-01-09 ENCOUNTER — Ambulatory Visit (HOSPITAL_COMMUNITY)
Admission: EM | Admit: 2018-01-09 | Discharge: 2018-01-09 | Disposition: A | Discharge status: Home / Self Care | Payer: 59

## 2018-01-09 ENCOUNTER — Encounter (HOSPITAL_COMMUNITY): Payer: Self-pay | Admitting: Emergency Medicine

## 2018-01-09 ENCOUNTER — Other Ambulatory Visit: Payer: Self-pay

## 2018-01-09 DIAGNOSIS — J069 Acute upper respiratory infection, unspecified: Secondary | ICD-10-CM

## 2018-01-09 DIAGNOSIS — R0981 Nasal congestion: Secondary | ICD-10-CM

## 2018-01-09 NOTE — Discharge Instructions (Signed)
Take Zyrtec-d (5-120) daily during the day.  Take oxymetazoline or Afrin nasal nightly for the next 3 nights.  If at any point you have fever please come back to the office.

## 2018-01-09 NOTE — ED Provider Notes (Signed)
01/09/2018 8:29 PM   DOB: [birth year] / MRN: [id number]  SUBJECTIVE:  Shane Hart is a 52 y.o. male presenting for sinus pressure.  This started about 3-4 days ago and the patient feels he is getting worse.  Tells me he has a "sinus infection."  Denies fever jaw pain facial tenderness and headache.  He has No Known Allergies.   He  has no past medical history on file.    He  reports that  has never smoked. He does not have any smokeless tobacco history on file. He reports that he does not drink alcohol or use drugs. He  reports that he currently engages in sexual activity. The patient  has a past surgical history that includes Knee surgery (Left, 1979) and Hernia repair (1992).  His family history includes Hypertension in his mother and sister.  ROS  Per HPI  OBJECTIVE:  BP 129/85 (BP Location: Left Wrist)   Pulse 67   Temp 98.1 F (36.7 C) (Oral)   SpO2 96%   Physical Exam  Constitutional: He appears well-developed. He is active and cooperative.  Non-toxic appearance.  HENT:  Right Ear: Hearing, tympanic membrane, external ear and ear canal normal.  Left Ear: Hearing, tympanic membrane, external ear and ear canal normal.  Nose: Nose normal. Right sinus exhibits no maxillary sinus tenderness and no frontal sinus tenderness. Left sinus exhibits no maxillary sinus tenderness and no frontal sinus tenderness.  Mouth/Throat: Uvula is midline, oropharynx is clear and moist and mucous membranes are normal. No oropharyngeal exudate, posterior oropharyngeal edema or tonsillar abscesses.  Eyes: Conjunctivae are normal. Pupils are equal, round, and reactive to light.  Cardiovascular: Normal rate, regular rhythm, S1 normal, S2 normal, normal heart sounds, intact distal pulses and normal pulses. Exam reveals no gallop and no friction rub.  No murmur heard. Pulmonary/Chest: Effort normal. No stridor. No tachypnea. No respiratory distress. He has no wheezes. He has no rales.  Abdominal: He  exhibits no distension.  Musculoskeletal: He exhibits no edema.  Lymphadenopathy:       Head (right side): No submandibular and no tonsillar adenopathy present.       Head (left side): No submandibular and no tonsillar adenopathy present.    He has no cervical adenopathy.  Neurological: He is alert.  Skin: Skin is warm and dry. He is not diaphoretic. No pallor.  Vitals reviewed.   No results found for this or any previous visit (from the past 72 hour(s)).  No results found.  ASSESSMENT AND PLAN:  No orders of the defined types were placed in this encounter.    Sinus congestion - Exam unimpressive.  Vitals are normal.  Advised Zyrtec-D fluids and Afrin.  Return here if fever.      The patient is advised to call or return to clinic if he does not see an improvement in symptoms, or to seek the care of the closest emergency department if he worsens with the above plan.   Deliah BostonMichael Denis Hart, MHS, PA-C 01/09/2018 8:29 PM    Shane Hart, Shane Labonte L, PA-C 01/09/18 2029

## 2018-01-09 NOTE — ED Triage Notes (Signed)
Pt reports sinus congestion and pressure for 3-4 days.

## 2018-01-10 DIAGNOSIS — G43909 Migraine, unspecified, not intractable, without status migrainosus: Secondary | ICD-10-CM | POA: Diagnosis not present

## 2018-07-19 DIAGNOSIS — M67962 Unspecified disorder of synovium and tendon, left lower leg: Secondary | ICD-10-CM | POA: Diagnosis not present

## 2018-07-30 DIAGNOSIS — Z13220 Encounter for screening for lipoid disorders: Secondary | ICD-10-CM | POA: Diagnosis not present

## 2018-07-30 DIAGNOSIS — Z0000 Encounter for general adult medical examination without abnormal findings: Secondary | ICD-10-CM | POA: Diagnosis not present

## 2018-07-30 DIAGNOSIS — S86012D Strain of left Achilles tendon, subsequent encounter: Secondary | ICD-10-CM | POA: Diagnosis not present

## 2018-07-30 DIAGNOSIS — S86012A Strain of left Achilles tendon, initial encounter: Secondary | ICD-10-CM | POA: Insufficient documentation

## 2018-08-20 DIAGNOSIS — M67879 Other specified disorders of synovium and tendon, unspecified ankle and foot: Secondary | ICD-10-CM | POA: Diagnosis not present

## 2018-08-22 DIAGNOSIS — S86012D Strain of left Achilles tendon, subsequent encounter: Secondary | ICD-10-CM | POA: Diagnosis not present

## 2019-04-02 DIAGNOSIS — M79651 Pain in right thigh: Secondary | ICD-10-CM | POA: Insufficient documentation

## 2019-08-11 ENCOUNTER — Encounter (HOSPITAL_COMMUNITY): Payer: Self-pay

## 2019-08-11 ENCOUNTER — Other Ambulatory Visit: Payer: Self-pay

## 2019-08-11 ENCOUNTER — Emergency Department (HOSPITAL_COMMUNITY)
Admission: EM | Admit: 2019-08-11 | Discharge: 2019-08-11 | Disposition: A | Discharge status: Home / Self Care | Payer: 59 | Attending: Emergency Medicine | Admitting: Emergency Medicine

## 2019-08-11 ENCOUNTER — Emergency Department (HOSPITAL_COMMUNITY): Payer: 59

## 2019-08-11 DIAGNOSIS — R51 Headache: Secondary | ICD-10-CM | POA: Diagnosis not present

## 2019-08-11 DIAGNOSIS — I483 Typical atrial flutter: Secondary | ICD-10-CM | POA: Insufficient documentation

## 2019-08-11 LAB — CBC
HCT: 42 % (ref 39.0–52.0)
Hemoglobin: 13.2 g/dL (ref 13.0–17.0)
MCH: 28.7 pg (ref 26.0–34.0)
MCHC: 31.4 g/dL (ref 30.0–36.0)
MCV: 91.3 fL (ref 80.0–100.0)
Platelets: 209 10*3/uL (ref 150–400)
RBC: 4.6 MIL/uL (ref 4.22–5.81)
RDW: 12.5 % (ref 11.5–15.5)
WBC: 5.5 10*3/uL (ref 4.0–10.5)
nRBC: 0 % (ref 0.0–0.2)

## 2019-08-11 LAB — BASIC METABOLIC PANEL
Anion gap: 8 (ref 5–15)
BUN: 9 mg/dL (ref 6–20)
CO2: 27 mmol/L (ref 22–32)
Calcium: 9.2 mg/dL (ref 8.9–10.3)
Chloride: 105 mmol/L (ref 98–111)
Creatinine, Ser: 0.79 mg/dL (ref 0.61–1.24)
GFR calc Af Amer: >60 mL/min (ref >60)
GFR calc non Af Amer: >60 mL/min (ref >60)
Glucose, Bld: 93 mg/dL (ref 70–99)
Potassium: 4.5 mmol/L (ref 3.5–5.1)
Sodium: 140 mmol/L (ref 135–145)

## 2019-08-11 LAB — MAGNESIUM: Magnesium: 2.1 mg/dL (ref 1.7–2.4)

## 2019-08-11 LAB — HEPATIC FUNCTION PANEL
ALT: 19 U/L (ref 0–44)
AST: 23 U/L (ref 15–41)
Albumin: 3.9 g/dL (ref 3.5–5.0)
Alkaline Phosphatase: 82 U/L (ref 38–126)
Bilirubin, Direct: 0.1 mg/dL (ref 0.0–0.2)
Indirect Bilirubin: 0.6 mg/dL (ref 0.3–0.9)
Total Bilirubin: 0.7 mg/dL (ref 0.3–1.2)
Total Protein: 7.6 g/dL (ref 6.5–8.1)

## 2019-08-11 LAB — TSH: TSH: 3.38 u[IU]/mL (ref 0.350–4.500)

## 2019-08-11 MED ORDER — APIXABAN 5 MG PO TABS: 5.0000 mg | ORAL_TABLET | Freq: Two times a day (BID) | ORAL | 0 refills | Status: DC

## 2019-08-11 MED ORDER — APIXABAN 5 MG PO TABS
5.0000 mg | ORAL_TABLET | Freq: Two times a day (BID) | ORAL | Status: DC
  Administered 2019-08-11: 5 mg via ORAL
  Filled 2019-08-11: qty 1

## 2019-08-11 MED ORDER — METOPROLOL TARTRATE 25 MG PO TABS
25.0000 mg | ORAL_TABLET | Freq: Once | ORAL | Status: AC
  Administered 2019-08-11: 25 mg via ORAL
  Filled 2019-08-11: qty 1

## 2019-08-11 MED ORDER — METOPROLOL TARTRATE 25 MG PO TABS: 25.0000 mg | ORAL_TABLET | Freq: Two times a day (BID) | ORAL | 0 refills | Status: DC

## 2019-08-11 MED ORDER — ACETAMINOPHEN 500 MG PO TABS
1000.0000 mg | ORAL_TABLET | Freq: Once | ORAL | Status: AC
  Administered 2019-08-11: 1000 mg via ORAL
  Filled 2019-08-11: qty 2

## 2019-08-11 NOTE — ED Notes (Signed)
[place] pts wife

## 2019-08-11 NOTE — ED Triage Notes (Signed)
Pt verbalized headache x 3 days and took his BP at home and it read high, pt went to [facility] and had ekg that showed the pt to be in a-fib rvr and BP was slightly low. Axox4. Pt has no hx of a-fib and denies CP or shob. Just has a headache. No neuro deficits.

## 2019-08-11 NOTE — ED Notes (Signed)
Patient verbalizes understanding of discharge instructions. Opportunity for questioning and answers were provided. Armband removed by staff, pt discharged from ED ambulatory. Pharmacy completed educated of Eliquis and Case Management gave coupon for Eliquis prescription.

## 2019-08-11 NOTE — ED Provider Notes (Signed)
Emergency Department Provider Note   I have reviewed the triage vital signs and the nursing notes.   HISTORY  Chief Complaint Atrial Fibrillation and Headache   HPI Shane Hart is a 53 y.o. male presents to the emergency department from his PCP office today.  Patient states he has had 3 days of headache without fever and today checked his blood pressure which was elevated.  He called his PCP who saw him in the office for an evaluation.  EKG today showed concern for A. fib with RVR and he was referred to the emergency department.  He denies any sudden onset, maximal intensity headache symptoms.  Describes headache as a dull, frontal, intermittent discomfort.  He denies any chest pain, heart palpitations, shortness of breath, near syncope.  He has no prior history of intracranial hemorrhage or GI bleeding.  He denies any unilateral weakness or numbness.  No vision changes.   History reviewed. No pertinent past medical history.  There are no active problems to display for this patient.   Past Surgical History:  Procedure Laterality Date   HERNIA REPAIR  1992   L groin   KNEE SURGERY Left 1979    Allergies Patient has no known allergies.  Family History  Problem Relation Age of Onset   Hypertension Mother    Hypertension Sister     Social History Social History   Tobacco Use   Smoking status: Never Smoker  Substance Use Topics   Alcohol use: No   Drug use: No    Review of Systems  Constitutional: No fever/chills Eyes: No visual changes. ENT: No sore throat. Cardiovascular: Denies chest pain. Positive irregular HR per PCP office.  Respiratory: Denies shortness of breath. Gastrointestinal: No abdominal pain.  No nausea, no vomiting.  No diarrhea.  No constipation. Genitourinary: Negative for dysuria. Musculoskeletal: Negative for back pain. Skin: Negative for rash. Neurological: Negative for focal weakness or numbness. Positive HA.   10-point ROS  otherwise negative.  ____________________________________________   PHYSICAL EXAM:  VITAL SIGNS: ED Triage Vitals  Enc Vitals Group     BP 08/11/19 1257 (!) 118/91     Pulse Rate 08/11/19 1257 (!) 59     Resp 08/11/19 1257 18     Temp 08/11/19 1257 98.3 F (36.8 C)     Temp Source 08/11/19 1257 Oral     SpO2 08/11/19 1257 100 %   Constitutional: Alert and oriented. Well appearing and in no acute distress. Eyes: Conjunctivae are normal.  Head: Atraumatic. Nose: No congestion/rhinnorhea. Mouth/Throat: Mucous membranes are moist.   Neck: No stridor.  Cardiovascular: A-flutter with variable block. Good peripheral circulation. Grossly normal heart sounds.   Respiratory: Normal respiratory effort.  No retractions. Lungs CTAB. Gastrointestinal: Soft and nontender. No distention.  Musculoskeletal: No gross deformities of extremities. Neurologic:  Normal speech and language.  Skin:  Skin is warm, dry and intact. No rash noted.   ____________________________________________   LABS (all labs ordered are listed, but only abnormal results are displayed)  Labs Reviewed  BASIC METABOLIC PANEL  CBC  MAGNESIUM  TSH  HEPATIC FUNCTION PANEL   ____________________________________________  EKG   EKG Interpretation  Date/Time:  Monday August 11 2019 13:02:25 EDT Ventricular Rate:  110 PR Interval:    QRS Duration: 88 QT Interval:  364 QTC Calculation: 492 R Axis:   24 Text Interpretation:  Atrial flutter with variable A-V block Nonspecific T wave abnormality Abnormal ECG No STEMI  Confirmed by Alona BeneLong, Talyn Eddie [phone]([id number]) on 08/11/2019  7:56:24 PM       ____________________________________________  RADIOLOGY  Dg Chest 2 View  Result Date: 08/11/2019 CLINICAL DATA:  Atrial fibrillation, abnormal EKG, nonsmoker EXAM: CHEST - 2 VIEW COMPARISON:  04/10/2013 FINDINGS: Normal heart size and pulmonary vascularity. Mild tortuosity of thoracic aorta. Lungs clear. No infiltrate, pleural  effusion or pneumothorax. Osseous structures unremarkable. IMPRESSION: No acute abnormalities. Electronically Signed   By: Ulyses SouthwardMark  Boles M.D.   On: 08/11/2019 15:31   Ct Head Wo Contrast  Result Date: 08/11/2019 CLINICAL DATA:  Headache EXAM: CT HEAD WITHOUT CONTRAST TECHNIQUE: Contiguous axial images were obtained from the base of the skull through the vertex without intravenous contrast. COMPARISON:  None. FINDINGS: Brain: No evidence of acute infarction, hemorrhage, hydrocephalus, extra-axial collection or mass lesion/mass effect. Vascular: Negative for hyperdense vessel Skull: Negative Sinuses/Orbits: Negative Other: None IMPRESSION: Negative CT head Electronically Signed   By: Marlan Palauharles  Clark M.D.   On: 08/11/2019 21:18    ____________________________________________   PROCEDURES  Procedure(s) performed:   Procedures  None ____________________________________________   INITIAL IMPRESSION / ASSESSMENT AND PLAN / ED COURSE  Pertinent labs & imaging results that were available during my care of the patient were reviewed by me and considered in my medical decision making (see chart for details).   Patient presents to the emergency department for evaluation of headache.  He went to his PCP who found him to be in atrial fib with RVR.  His EKG here is more consistent with flutter and variable block.  Patient is asymptomatic in terms of the arrhythmia.  I do not appreciate any stroke symptoms.  No findings on exam or by history to increase suspicion for subarachnoid hemorrhage.  Patient does not have an absolute contraindication to anticoagulation.  Given his persistent headache I do plan on CT imaging of the head along with additional lab work which I have added.  Will give p.o. metoprolol for mild rate control.  Patient is in the high 90s, low 100s on my exam.   This patients CHA2DS2-VASc Score and unadjusted Ischemic Stroke Rate (% per year) is equal to 0.2 % stroke rate/year from a score of  0  Above score calculated as 1 point each if present [CHF, HTN, DM, Vascular=MI/PAD/Aortic Plaque, Age if 65-74, or Male] Above score calculated as 2 points each if present [Age > 75, or Stroke/TIA/TE]  HR well controlled with PO metoprolol. Started on Eliquis. Will f/u with both PCP and Cardiology. Referral made from the ED. Wife at bedside for discharge discussion. Patient and wife are comfortable with the plan at discharge.   ____________________________________________  FINAL CLINICAL IMPRESSION(S) / ED DIAGNOSES  Final diagnoses:  Typical atrial flutter (HCC)     MEDICATIONS GIVEN DURING THIS VISIT:  Medications  metoprolol tartrate (LOPRESSOR) tablet 25 mg (25 mg Oral Given 08/11/19 2013)  acetaminophen (TYLENOL) tablet 1,000 mg (1,000 mg Oral Given 08/11/19 2012)     NEW OUTPATIENT MEDICATIONS STARTED DURING THIS VISIT:  Discharge Medication List as of 08/11/2019 10:42 PM    START taking these medications   Details  apixaban (ELIQUIS) 5 MG TABS tablet Take 1 tablet (5 mg total) by mouth 2 (two) times daily., Starting Mon 08/11/2019, Until Wed 09/10/2019, Print    metoprolol tartrate (LOPRESSOR) 25 MG tablet Take 1 tablet (25 mg total) by mouth 2 (two) times daily., Starting Mon 08/11/2019, Until Wed 09/10/2019, Print        Note:  This document was prepared using Conservation officer, historic buildingsDragon voice recognition software and  may include unintentional dictation errors.  Nanda Quinton, MD Emergency Medicine    Jiovany Scheffel, [facility], MD 08/12/19 [phone]

## 2019-08-11 NOTE — Discharge Instructions (Addendum)
You were seen in the emergency department today with a heart rhythm called atrial flutter.  We are starting you on a blood thinning medication as well as a medication to keep your heart rate low.  You will need to follow-up with the cardiology office.  They should be calling you to schedule a follow-up appointment.  If you do not hear from them tomorrow please call the number provided.  You cannot take aspirin, ibuprofen, naproxen, or other NSAID medications while taking Eliquis.  If you notice any signs of bleeding he need to present to the emergency department immediately.  Call your PCP to discuss her diagnosis and further management from their perspective.   Information on my medicine - ELIQUIS (apixaban)  This medication education was reviewed with me or my healthcare representative as part of my discharge preparation.   Why was Eliquis prescribed for you? Eliquis was prescribed for you to reduce the risk of a blood clot forming that can cause a stroke if you have a medical condition called atrial fibrillation (a type of irregular heartbeat).  What do You need to know about Eliquis ? Take your Eliquis TWICE DAILY - one tablet in the morning and one tablet in the evening with or without food. If you have difficulty swallowing the tablet whole please discuss with your pharmacist how to take the medication safely.  Take Eliquis exactly as prescribed by your doctor and DO NOT stop taking Eliquis without talking to the doctor who prescribed the medication.  Stopping may increase your risk of developing a stroke.  Refill your prescription before you run out.  After discharge, you should have regular check-up appointments with your healthcare provider that is prescribing your Eliquis.  In the future your dose may need to be changed if your kidney function or weight changes by a significant amount or as you get older.  What do you do if you miss a dose? If you miss a dose, take it as soon as you  remember on the same day and resume taking twice daily.  Do not take more than one dose of ELIQUIS at the same time to make up a missed dose.  Important Safety Information A possible side effect of Eliquis is bleeding. You should call your healthcare provider right away if you experience any of the following: ? Bleeding from an injury or your nose that does not stop. ? Unusual colored urine (red or dark brown) or unusual colored stools (red or black). ? Unusual bruising for unknown reasons. ? A serious fall or if you hit your head (even if there is no bleeding).  Some medicines may interact with Eliquis and might increase your risk of bleeding or clotting while on Eliquis. To help avoid this, consult your healthcare provider or pharmacist prior to using any new prescription or non-prescription medications, including herbals, vitamins, non-steroidal anti-inflammatory drugs (NSAIDs) and supplements.  This website has more information on Eliquis (apixaban): http://www.eliquis.com/eliquis/home

## 2019-08-14 ENCOUNTER — Other Ambulatory Visit: Payer: Self-pay

## 2019-08-14 ENCOUNTER — Ambulatory Visit (HOSPITAL_COMMUNITY)
Admission: RE | Admit: 2019-08-14 | Discharge: 2019-08-14 | Disposition: A | Discharge status: Home / Self Care | Payer: 59 | Source: Ambulatory Visit | Attending: Physician Assistant | Admitting: Physician Assistant

## 2019-08-14 VITALS — BP 118/70 | HR 112 | Ht 72.0 in | Wt 342.2 lb

## 2019-08-14 DIAGNOSIS — Z7901 Long term (current) use of anticoagulants: Secondary | ICD-10-CM | POA: Diagnosis not present

## 2019-08-14 DIAGNOSIS — G4733 Obstructive sleep apnea (adult) (pediatric): Secondary | ICD-10-CM | POA: Diagnosis not present

## 2019-08-14 DIAGNOSIS — Z8249 Family history of ischemic heart disease and other diseases of the circulatory system: Secondary | ICD-10-CM | POA: Insufficient documentation

## 2019-08-14 DIAGNOSIS — Z79899 Other long term (current) drug therapy: Secondary | ICD-10-CM | POA: Diagnosis not present

## 2019-08-14 DIAGNOSIS — Z6841 Body Mass Index (BMI) 40.0 and over, adult: Secondary | ICD-10-CM | POA: Diagnosis not present

## 2019-08-14 DIAGNOSIS — E669 Obesity, unspecified: Secondary | ICD-10-CM | POA: Insufficient documentation

## 2019-08-14 DIAGNOSIS — I4891 Unspecified atrial fibrillation: Secondary | ICD-10-CM | POA: Diagnosis present

## 2019-08-14 DIAGNOSIS — I483 Typical atrial flutter: Secondary | ICD-10-CM

## 2019-08-14 MED ORDER — DILTIAZEM HCL ER COATED BEADS 120 MG PO CP24: 120.0000 mg | ORAL_CAPSULE | Freq: Every day | ORAL | 3 refills | Status: DC

## 2019-08-14 NOTE — Patient Instructions (Signed)
Cardizem 120mg  once a day

## 2019-08-14 NOTE — Progress Notes (Signed)
Primary Care Physician: Patient, No Pcp Per Primary Cardiologist: none Primary Electrophysiologist: none Referring Physician: Dr Jacqulyn BathLong (ER)   Shane Hart is a 53 y.o. male with a history of OSA and new onset atrial flutter who presents for consultation in the [facility].  The patient was initially diagnosed with atrial flutter after presenting to his PCP office with symptoms of a persistent headache. An ECG was done and revealed afib with RVR and he was referred to the ER. On arrival, he appeared to be in atrial flutter with variable conduction. Patient was unaware of his arrhythmia. He was discharged on metoprolol and Eliquis. Patient denies any alcohol use. He has OSA and is compliant with his CPAP. He denies any symptoms from his atrial flutter. There were no specific triggers that the patient could identify.   Today, he denies symptoms of palpitations, chest pain, shortness of breath, orthopnea, PND, lower extremity edema, dizziness, presyncope, syncope, snoring, daytime somnolence, bleeding, or neurologic sequela. The patient is tolerating medications without difficulties and is otherwise without complaint today.    Atrial Fibrillation Risk Factors:  he does have symptoms or diagnosis of sleep apnea. he is compliant with CPAP therapy. he does not have a history of rheumatic fever. he does not have a history of alcohol use. The patient does not have a history of early familial atrial fibrillation or other arrhythmias.  he has a BMI of Body mass index is 46.41 kg/m.Marland Kitchen. Filed Weights   08/14/19 1447  Weight: (!) 155.2 kg    Family History  Problem Relation Age of Onset  . Hypertension Mother   . Hypertension Sister      Atrial Fibrillation Management history:  Previous antiarrhythmic drugs: none Previous cardioversions: none Previous ablations: none CHADS2VASC score: 0 Anticoagulation history: Eliquis   No past medical history on file. Past  Surgical History:  Procedure Laterality Date  . HERNIA REPAIR  1992   L groin  . KNEE SURGERY Left 1979    Current Outpatient Medications  Medication Sig Dispense Refill  . apixaban (ELIQUIS) 5 MG TABS tablet Take 1 tablet (5 mg total) by mouth 2 (two) times daily. 60 tablet 0  . diazepam (VALIUM) 5 MG tablet Take 1 tablet (5 mg total) by mouth every 8 (eight) hours as needed for muscle spasms. 15 tablet 0  . metoprolol tartrate (LOPRESSOR) 25 MG tablet Take 1 tablet (25 mg total) by mouth 2 (two) times daily. 60 tablet 0  . diltiazem (CARDIZEM CD) 120 MG 24 hr capsule Take 1 capsule (120 mg total) by mouth daily. 30 capsule 3  . HYDROcodone-acetaminophen (NORCO) 10-325 MG tablet Take 1 tablet by mouth every 6 (six) hours as needed. (Patient not taking: Reported on 08/11/2019) 30 tablet 0  . ibuprofen (ADVIL,MOTRIN) 600 MG tablet Take 1 tablet (600 mg total) by mouth every 6 (six) hours as needed. (Patient not taking: Reported on 08/11/2019) 30 tablet 0  . oxyCODONE-acetaminophen (PERCOCET) 5-325 MG tablet Take 1 tablet by mouth every 4 (four) hours as needed for severe pain. (Patient not taking: Reported on 08/11/2019) 20 tablet 0  . Phenylephrine-Acetaminophen (TYLENOL SINUS+HEADACHE PO) Take 1 tablet by mouth every 6 (six) hours as needed (for sinus headaches or congestion).     No current facility-administered medications for this encounter.     No Known Allergies  Social History   Socioeconomic History  . Marital status: Married    Spouse name: Not on file  . Number of children:  Not on file  . Years of education: Not on file  . Highest education level: Not on file  Occupational History  . Not on file  Social Needs  . Financial resource strain: Not on file  . Food insecurity    Worry: Not on file    Inability: Not on file  . Transportation needs    Medical: Not on file    Non-medical: Not on file  Tobacco Use  . Smoking status: Never Smoker  Substance and Sexual Activity   . Alcohol use: No  . Drug use: No  . Sexual activity: Yes  Lifestyle  . Physical activity    Days per week: Not on file    Minutes per session: Not on file  . Stress: Not on file  Relationships  . Social Herbalist on phone: Not on file    Gets together: Not on file    Attends religious service: Not on file    Active member of club or organization: Not on file    Attends meetings of clubs or organizations: Not on file    Relationship status: Not on file  . Intimate partner violence    Fear of current or ex partner: Not on file    Emotionally abused: Not on file    Physically abused: Not on file    Forced sexual activity: Not on file  Other Topics Concern  . Not on file  Social History Narrative  . Not on file     ROS- All systems are reviewed and negative except as per the HPI above.  Physical Exam: Vitals:   08/14/19 1447  BP: 118/70  Pulse: (!) 112  Weight: (!) 155.2 kg  Height: 6' (1.829 m)    GEN- The patient is well appearing obese male, alert and oriented x 3 today.   Head- normocephalic, atraumatic Eyes-  Sclera clear, conjunctiva pink Ears- hearing intact Oropharynx- clear Neck- supple  Lungs- Clear to ausculation bilaterally, normal work of breathing Heart- irregular rate and rhythm, no murmurs, rubs or gallops  GI- soft, NT, ND, + BS Extremities- no clubbing, cyanosis, or edema MS- no significant deformity or atrophy Skin- no rash or lesion Psych- euthymic mood, full affect Neuro- strength and sensation are intact  Wt Readings from Last 3 Encounters:  08/14/19 (!) 155.2 kg  07/16/14 (!) 151.5 kg  07/11/13 (!) 150.6 kg    EKG today demonstrates typical atrial flutter with variable conduction HR 112, QRS 68, QTc 480  Epic records are reviewed at length today  Assessment and Plan:  1. Typical atrial flutter Patient remains in atrial flutter today with elevated HR. General education about atrial flutter provided and questions  answered. We also discussed his stroke risk and the risks and benefits of anticoagulation. We discussed therapeutic options including DCCV vs ablation. Patient would like to pursue DCCV at this time. Will arrange once he has been on uninterrupted anticoagulation for 3 weeks (started 8/18). Continue Eliquis 5 mg BID Will start diltiazem 120 mg daily for better rate control. Continue Lopressor 25 mg BID TSH and electrolytes normal at ER visit. Check echocardiogram once SR restored.  This patients CHA2DS2-VASc Score and unadjusted Ischemic Stroke Rate (% per year) is equal to 0.2 % stroke rate/year from a score of 0  Above score calculated as 1 point each if present [CHF, HTN, DM, Vascular=MI/PAD/Aortic Plaque, Age if 65-74, or Male] Above score calculated as 2 points each if present [Age > 75, or  Stroke/TIA/TE]   2. Obesity Body mass index is 46.41 kg/m. Lifestyle modification was discussed at length including regular exercise and weight reduction.  3. OSA Discussed the importance of treating sleep apnea for maintaining SR long term. Patient is compliant with CPAP.   Follow up in the AF clinic in two weeks.   Jorja Loaicky Daryl Quiros PA-C Afib Clinic [facility] [address] [place], [place] [address] [phone] 08/14/2019 4:21 PM

## 2019-08-28 ENCOUNTER — Encounter (HOSPITAL_COMMUNITY): Payer: Self-pay | Admitting: *Deleted

## 2019-08-28 ENCOUNTER — Other Ambulatory Visit: Payer: Self-pay

## 2019-08-28 ENCOUNTER — Ambulatory Visit (HOSPITAL_COMMUNITY)
Admission: RE | Admit: 2019-08-28 | Discharge: 2019-08-28 | Disposition: A | Discharge status: Home / Self Care | Payer: 59 | Source: Ambulatory Visit | Attending: Physician Assistant | Admitting: Physician Assistant

## 2019-08-28 ENCOUNTER — Encounter (HOSPITAL_COMMUNITY): Payer: Self-pay | Admitting: Physician Assistant

## 2019-08-28 VITALS — BP 108/78 | HR 87 | Ht 72.0 in | Wt 345.8 lb

## 2019-08-28 DIAGNOSIS — Z79899 Other long term (current) drug therapy: Secondary | ICD-10-CM | POA: Insufficient documentation

## 2019-08-28 DIAGNOSIS — I483 Typical atrial flutter: Secondary | ICD-10-CM | POA: Insufficient documentation

## 2019-08-28 DIAGNOSIS — G4733 Obstructive sleep apnea (adult) (pediatric): Secondary | ICD-10-CM | POA: Diagnosis not present

## 2019-08-28 DIAGNOSIS — Z7901 Long term (current) use of anticoagulants: Secondary | ICD-10-CM | POA: Insufficient documentation

## 2019-08-28 DIAGNOSIS — E669 Obesity, unspecified: Secondary | ICD-10-CM | POA: Insufficient documentation

## 2019-08-28 DIAGNOSIS — Z6841 Body Mass Index (BMI) 40.0 and over, adult: Secondary | ICD-10-CM | POA: Insufficient documentation

## 2019-08-28 LAB — BASIC METABOLIC PANEL
Anion gap: 12 (ref 5–15)
BUN: 15 mg/dL (ref 6–20)
CO2: 22 mmol/L (ref 22–32)
Calcium: 8.9 mg/dL (ref 8.9–10.3)
Chloride: 107 mmol/L (ref 98–111)
Creatinine, Ser: 0.87 mg/dL (ref 0.61–1.24)
GFR calc Af Amer: >60 mL/min (ref >60)
GFR calc non Af Amer: >60 mL/min (ref >60)
Glucose, Bld: 98 mg/dL (ref 70–99)
Potassium: 3.8 mmol/L (ref 3.5–5.1)
Sodium: 141 mmol/L (ref 135–145)

## 2019-08-28 LAB — CBC
HCT: 38.4 % — ABNORMAL LOW (ref 39.0–52.0)
Hemoglobin: 11.9 g/dL — ABNORMAL LOW (ref 13.0–17.0)
MCH: 28.4 pg (ref 26.0–34.0)
MCHC: 31 g/dL (ref 30.0–36.0)
MCV: 91.6 fL (ref 80.0–100.0)
Platelets: 224 10*3/uL (ref 150–400)
RBC: 4.19 MIL/uL — ABNORMAL LOW (ref 4.22–5.81)
RDW: 12.7 % (ref 11.5–15.5)
WBC: 4.1 10*3/uL (ref 4.0–10.5)
nRBC: 0 % (ref 0.0–0.2)

## 2019-08-28 NOTE — Progress Notes (Signed)
Primary Care Physician: Patient, No Pcp Per Primary Cardiologist: none Primary Electrophysiologist: none Referring Physician: Dr Jacqulyn BathLong (ER)   Shane Hart is a 53 y.o. male with a history of OSA and new onset atrial flutter who presents [facility] the [facility].  The patient was initially diagnosed with atrial flutter after presenting to his PCP office with symptoms of a persistent headache. An ECG was done and revealed afib with RVR and he was referred to the ER. On arrival, he appeared to be in atrial flutter with variable conduction. Patient was unaware of his arrhythmia. He was discharged on metoprolol and Eliquis. Patient denies any alcohol use. He has OSA and is compliant with his CPAP. He denies any symptoms from his atrial flutter. There were no specific triggers that the patient could identify.   On follow up today, patient reports that he has done well since his last visit. He remains unaware of his arrhythmia. His heart rate is better controlled with the addition of diltiazem. No bleeding issues with Eliquis.  Today, he denies symptoms of palpitations, chest pain, shortness of breath, orthopnea, PND, lower extremity edema, dizziness, presyncope, syncope, snoring, daytime somnolence, bleeding, or neurologic sequela. The patient is tolerating medications without difficulties and is otherwise without complaint today.    Atrial Fibrillation Risk Factors:  he does have symptoms or diagnosis of sleep apnea. he is compliant with CPAP therapy. he does not have a history of rheumatic fever. he does not have a history of alcohol use. The patient does not have a history of early familial atrial fibrillation or other arrhythmias.  he has a BMI of Body mass index is 46.9 kg/m.Marland Kitchen. Filed Weights   08/28/19 1339  Weight: (!) 156.9 kg    Family History  Problem Relation Age of Onset  . Hypertension Mother   . Hypertension Sister      Atrial  Fibrillation Management history:  Previous antiarrhythmic drugs: none Previous cardioversions: none Previous ablations: none CHADS2VASC score: 0 Anticoagulation history: Eliquis   No past medical history on file. Past Surgical History:  Procedure Laterality Date  . HERNIA REPAIR  1992   L groin  . KNEE SURGERY Left 1979    Current Outpatient Medications  Medication Sig Dispense Refill  . apixaban (ELIQUIS) 5 MG TABS tablet Take 1 tablet (5 mg total) by mouth 2 (two) times daily. 60 tablet 0  . diazepam (VALIUM) 5 MG tablet Take 1 tablet (5 mg total) by mouth every 8 (eight) hours as needed for muscle spasms. 15 tablet 0  . diltiazem (CARDIZEM CD) 120 MG 24 hr capsule Take 1 capsule (120 mg total) by mouth daily. 30 capsule 3  . metoprolol tartrate (LOPRESSOR) 25 MG tablet Take 1 tablet (25 mg total) by mouth 2 (two) times daily. 60 tablet 0   No current facility-administered medications for this encounter.     No Known Allergies  Social History   Socioeconomic History  . Marital status: Married    Spouse name: Not on file  . Number of children: Not on file  . Years of education: Not on file  . Highest education level: Not on file  Occupational History  . Not on file  Social Needs  . Financial resource strain: Not on file  . Food insecurity    Worry: Not on file    Inability: Not on file  . Transportation needs    Medical: Not on file    Non-medical: Not on  file  Tobacco Use  . Smoking status: Never Smoker  . Smokeless tobacco: Never Used  Substance and Sexual Activity  . Alcohol use: No  . Drug use: No  . Sexual activity: Yes  Lifestyle  . Physical activity    Days per week: Not on file    Minutes per session: Not on file  . Stress: Not on file  Relationships  . Social Herbalist on phone: Not on file    Gets together: Not on file    Attends religious service: Not on file    Active member of club or organization: Not on file    Attends  meetings of clubs or organizations: Not on file    Relationship status: Not on file  . Intimate partner violence    Fear of current or ex partner: Not on file    Emotionally abused: Not on file    Physically abused: Not on file    Forced sexual activity: Not on file  Other Topics Concern  . Not on file  Social History Narrative  . Not on file     ROS- All systems are reviewed and negative except as per the HPI above.  Physical Exam: Vitals:   08/28/19 1339  BP: 108/78  Pulse: 87  Weight: (!) 156.9 kg  Height: 6' (1.829 m)    GEN- The patient is well appearing obese male, alert and oriented x 3 today.   HEENT-head normocephalic, atraumatic, sclera clear, conjunctiva pink, hearing intact, trachea midline. Lungs- Clear to ausculation bilaterally, normal work of breathing Heart- irregular rate and rhythm, no murmurs, rubs or gallops  GI- soft, NT, ND, + BS Extremities- no clubbing, cyanosis, or edema MS- no significant deformity or atrophy Skin- no rash or lesion Psych- euthymic mood, full affect Neuro- strength and sensation are intact   Wt Readings from Last 3 Encounters:  08/28/19 (!) 156.9 kg  08/14/19 (!) 155.2 kg  07/16/14 (!) 151.5 kg    EKG today demonstrates typical atrial flutter with variable conduction HR 87, QRS 88, QTc 457  Epic records are reviewed at length today  Assessment and Plan:  1. Typical atrial flutter Patient rate controlled. Will arrange DCCV once he has been on uninterrupted anticoagulation for 3 weeks (9/8). Continue Eliquis 5 mg BID for at least 4 weeks post DCCV. Continue diltiazem 120 mg daily Continue Lopressor 25 mg BID Bmet/CBC today. Check echocardiogram once SR restored.  This patients CHA2DS2-VASc Score and unadjusted Ischemic Stroke Rate (% per year) is equal to 0.2 % stroke rate/year from a score of 0  Above score calculated as 1 point each if present [CHF, HTN, DM, Vascular=MI/PAD/Aortic Plaque, Age if 65-74, or Male]  Above score calculated as 2 points each if present [Age > 75, or Stroke/TIA/TE]   2. Obesity Body mass index is 46.9 kg/m. Lifestyle modification was discussed and encouraged including regular physical activity and weight reduction.  3. OSA Discussed the importance of treating sleep apnea for maintaining SR long term. Patient compliant with CPAP.   Follow up in the AF clinic one week post DCCV.   [facility] [address] [place], [place] [address] [phone] 08/28/2019 2:07 PM

## 2019-08-28 NOTE — Patient Instructions (Addendum)
Cardioversion scheduled for Tuesday, September 15th  - Arrive at the [facility] and go to admitting at 12pm   -Do not eat or drink anything after midnight the night prior to your procedure.  - Take all your morning medication except diabetic medications with a sip of water prior to arrival.  - You will not be able to drive home after your procedure.

## 2019-09-02 ENCOUNTER — Telehealth: Payer: Self-pay | Admitting: Cardiovascular Disease

## 2019-09-02 NOTE — Telephone Encounter (Signed)
Defer to afib clinic for  Written note

## 2019-09-02 NOTE — Telephone Encounter (Signed)
  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 They will not allow him to work until then. Patient is asking if we can email the note to him at [email or web address]

## 2019-09-02 NOTE — Telephone Encounter (Signed)
Left message to discuss

## 2019-09-05 ENCOUNTER — Other Ambulatory Visit (HOSPITAL_COMMUNITY)
Admission: RE | Admit: 2019-09-05 | Discharge: 2019-09-05 | Disposition: A | Discharge status: Home / Self Care | Payer: 59 | Source: Ambulatory Visit | Attending: Cardiovascular Disease | Admitting: Cardiovascular Disease

## 2019-09-05 ENCOUNTER — Other Ambulatory Visit: Payer: Self-pay

## 2019-09-05 DIAGNOSIS — Z20828 Contact with and (suspected) exposure to other viral communicable diseases: Secondary | ICD-10-CM | POA: Diagnosis not present

## 2019-09-05 DIAGNOSIS — Z01812 Encounter for preprocedural laboratory examination: Secondary | ICD-10-CM | POA: Insufficient documentation

## 2019-09-06 LAB — NOVEL CORONAVIRUS, NAA (HOSP ORDER, SEND-OUT TO REF LAB; TAT 18-24 HRS): SARS-CoV-2, NAA: NOT DETECTED

## 2019-09-08 ENCOUNTER — Encounter (HOSPITAL_COMMUNITY): Payer: Self-pay | Admitting: Anesthesiology

## 2019-09-08 NOTE — Progress Notes (Signed)
Pt states she has remained quarantined since his covid test. Pt verbalizes understanding of NPO status and visitor status.

## 2019-09-08 NOTE — Anesthesia Preprocedure Evaluation (Deleted)
Anesthesia Evaluation    Reviewed: Allergy & Precautions, Patient's Chart, lab work & pertinent test results  Airway        Dental   Pulmonary neg pulmonary ROS,           Cardiovascular negative cardio ROS       Neuro/Psych negative neurological ROS     GI/Hepatic negative GI ROS, Neg liver ROS,   Endo/Other  Morbid obesity  Renal/GU negative Renal ROS     Musculoskeletal negative musculoskeletal ROS (+)   Abdominal (+) + obese,   Peds  Hematology  (+) anemia ,   Anesthesia Other Findings A-FIB  Reproductive/Obstetrics                             Anesthesia Physical Anesthesia Plan Anesthesia Quick Evaluation

## 2019-09-09 ENCOUNTER — Ambulatory Visit (HOSPITAL_COMMUNITY)
Admission: RE | Admit: 2019-09-09 | Discharge: 2019-09-09 | Disposition: A | Discharge status: Home / Self Care | Payer: 59 | Attending: Cardiovascular Disease | Admitting: Cardiovascular Disease

## 2019-09-09 ENCOUNTER — Encounter (HOSPITAL_COMMUNITY): Payer: Self-pay | Admitting: Cardiovascular Disease

## 2019-09-09 ENCOUNTER — Other Ambulatory Visit: Payer: Self-pay

## 2019-09-09 ENCOUNTER — Encounter (HOSPITAL_COMMUNITY)
Admission: RE | Disposition: A | Discharge status: Home / Self Care | Payer: Self-pay | Source: Home / Self Care | Attending: Cardiovascular Disease

## 2019-09-09 DIAGNOSIS — I4891 Unspecified atrial fibrillation: Secondary | ICD-10-CM | POA: Insufficient documentation

## 2019-09-09 DIAGNOSIS — Z5309 Procedure and treatment not carried out because of other contraindication: Secondary | ICD-10-CM | POA: Insufficient documentation

## 2019-09-09 DIAGNOSIS — I483 Typical atrial flutter: Secondary | ICD-10-CM | POA: Diagnosis not present

## 2019-09-09 DIAGNOSIS — Z6841 Body Mass Index (BMI) 40.0 and over, adult: Secondary | ICD-10-CM | POA: Insufficient documentation

## 2019-09-09 DIAGNOSIS — Z7901 Long term (current) use of anticoagulants: Secondary | ICD-10-CM | POA: Insufficient documentation

## 2019-09-09 DIAGNOSIS — Z79899 Other long term (current) drug therapy: Secondary | ICD-10-CM | POA: Diagnosis not present

## 2019-09-09 DIAGNOSIS — E669 Obesity, unspecified: Secondary | ICD-10-CM | POA: Insufficient documentation

## 2019-09-09 DIAGNOSIS — G4733 Obstructive sleep apnea (adult) (pediatric): Secondary | ICD-10-CM | POA: Diagnosis not present

## 2019-09-09 SURGERY — CANCELLED PROCEDURE

## 2019-09-09 NOTE — Progress Notes (Signed)
Pt's cardioversion cancelled, in normal sinus rhythm. Confirmed with 12 lead EKG, Dr Oval Linsey reviewed and agreed that pt's cardioversion could be cancelled. Jobe Igo, RN

## 2019-09-09 NOTE — CV Procedure (Signed)
Mr. Shane Hart presented today for cardioversion and was in sinus rhythm.  The procedure was cancelled.  [name], MD, [facility] 09/09/2019

## 2019-09-16 ENCOUNTER — Other Ambulatory Visit: Payer: Self-pay

## 2019-09-16 ENCOUNTER — Encounter (HOSPITAL_COMMUNITY): Payer: Self-pay | Admitting: Physician Assistant

## 2019-09-16 ENCOUNTER — Ambulatory Visit (HOSPITAL_COMMUNITY)
Admission: RE | Admit: 2019-09-16 | Discharge: 2019-09-16 | Disposition: A | Discharge status: Home / Self Care | Payer: 59 | Source: Ambulatory Visit | Attending: Physician Assistant | Admitting: Physician Assistant

## 2019-09-16 VITALS — BP 118/82 | HR 101 | Ht 72.0 in | Wt 341.8 lb

## 2019-09-16 DIAGNOSIS — G4733 Obstructive sleep apnea (adult) (pediatric): Secondary | ICD-10-CM | POA: Diagnosis not present

## 2019-09-16 DIAGNOSIS — Z7901 Long term (current) use of anticoagulants: Secondary | ICD-10-CM | POA: Insufficient documentation

## 2019-09-16 DIAGNOSIS — I483 Typical atrial flutter: Secondary | ICD-10-CM

## 2019-09-16 DIAGNOSIS — E669 Obesity, unspecified: Secondary | ICD-10-CM | POA: Insufficient documentation

## 2019-09-16 DIAGNOSIS — I4892 Unspecified atrial flutter: Secondary | ICD-10-CM | POA: Diagnosis not present

## 2019-09-16 DIAGNOSIS — Z79899 Other long term (current) drug therapy: Secondary | ICD-10-CM | POA: Diagnosis not present

## 2019-09-16 DIAGNOSIS — Z6841 Body Mass Index (BMI) 40.0 and over, adult: Secondary | ICD-10-CM | POA: Diagnosis not present

## 2019-09-16 LAB — CBC
HCT: 39 % (ref 39.0–52.0)
Hemoglobin: 12.4 g/dL — ABNORMAL LOW (ref 13.0–17.0)
MCH: 28.6 pg (ref 26.0–34.0)
MCHC: 31.8 g/dL (ref 30.0–36.0)
MCV: 90.1 fL (ref 80.0–100.0)
Platelets: 211 10*3/uL (ref 150–400)
RBC: 4.33 MIL/uL (ref 4.22–5.81)
RDW: 12.7 % (ref 11.5–15.5)
WBC: 5.4 10*3/uL (ref 4.0–10.5)
nRBC: 0 % (ref 0.0–0.2)

## 2019-09-16 MED ORDER — APIXABAN 5 MG PO TABS: 5.0000 mg | ORAL_TABLET | Freq: Two times a day (BID) | ORAL | 6 refills | Status: DC

## 2019-09-16 MED ORDER — METOPROLOL TARTRATE 25 MG PO TABS: 25.0000 mg | ORAL_TABLET | Freq: Two times a day (BID) | ORAL | 6 refills | Status: DC

## 2019-09-16 MED ORDER — DILTIAZEM HCL ER COATED BEADS 120 MG PO CP24: 120.0000 mg | ORAL_CAPSULE | Freq: Every day | ORAL | 3 refills | Status: DC

## 2019-09-16 NOTE — Progress Notes (Signed)
Primary Care Physician: Patient, No Pcp Per Primary Cardiologist: none Primary Electrophysiologist: none Referring Physician: Dr Laverta Baltimore (ER)   Shane Hart is a 53 y.o. male with a history of OSA and new onset atrial flutter who presents for consultation in the [facility].  The patient was initially diagnosed with atrial flutter after presenting to his PCP office with symptoms of a persistent headache. An ECG was done and revealed afib with RVR and he was referred to the ER. On arrival, he appeared to be in atrial flutter with variable conduction. Patient was unaware of his arrhythmia. He was discharged on metoprolol and Eliquis. Patient denies any alcohol use. He has OSA and is compliant with his CPAP. He denies any symptoms from his atrial flutter. There were no specific triggers that the patient could identify.   On follow up today, patient presented for DCCV and was found to be in [place]. Unfortunately, he is back in typical atrial flutter today. He remains unaware of his arrhythmia. Patient reports that he took his last dose of Eliquis, metoprolol, and diltiazem yesterday.  Today, he denies symptoms of palpitations, chest pain, shortness of breath, orthopnea, PND, lower extremity edema, dizziness, presyncope, syncope, snoring, daytime somnolence, bleeding, or neurologic sequela. The patient is tolerating medications without difficulties and is otherwise without complaint today.    Atrial Fibrillation Risk Factors:  he does have symptoms or diagnosis of sleep apnea. he is compliant with CPAP therapy. he does not have a history of rheumatic fever. he does not have a history of alcohol use. The patient does not have a history of early familial atrial fibrillation or other arrhythmias.  he has a BMI of Body mass index is 46.36 kg/m.Marland Kitchen Filed Weights   09/16/19 1513  Weight: (!) 155 kg    Family History  Problem Relation Age of Onset  . Hypertension Mother   .  Hypertension Sister      Atrial Fibrillation Management history:  Previous antiarrhythmic drugs: none Previous cardioversions: none Previous ablations: none CHADS2VASC score: 0 Anticoagulation history: Eliquis   No past medical history on file. Past Surgical History:  Procedure Laterality Date  . HERNIA REPAIR  1992   L groin  . KNEE SURGERY Left 1979    Current Outpatient Medications  Medication Sig Dispense Refill  . apixaban (ELIQUIS) 5 MG TABS tablet Take 1 tablet (5 mg total) by mouth 2 (two) times daily. 60 tablet 6  . diltiazem (CARDIZEM CD) 120 MG 24 hr capsule Take 1 capsule (120 mg total) by mouth daily. 30 capsule 3  . metoprolol tartrate (LOPRESSOR) 25 MG tablet Take 1 tablet (25 mg total) by mouth 2 (two) times daily. 60 tablet 6  . Omega-3 Fatty Acids (OMEGA-3 EPA FISH OIL PO) Take by mouth.    Marland Kitchen Specialty Vitamins Products (WEIGHT LOSS DAILY MULTI PO) Take 1 tablet by mouth daily. Weight loss Gummies     No current facility-administered medications for this encounter.     No Known Allergies  Social History   Socioeconomic History  . Marital status: Married    Spouse name: Not on file  . Number of children: Not on file  . Years of education: Not on file  . Highest education level: Not on file  Occupational History  . Not on file  Social Needs  . Financial resource strain: Not on file  . Food insecurity    Worry: Not on file    Inability: Not on file  .  Transportation needs    Medical: Not on file    Non-medical: Not on file  Tobacco Use  . Smoking status: Never Smoker  . Smokeless tobacco: Never Used  Substance and Sexual Activity  . Alcohol use: No  . Drug use: No  . Sexual activity: Yes  Lifestyle  . Physical activity    Days per week: Not on file    Minutes per session: Not on file  . Stress: Not on file  Relationships  . Social Musician on phone: Not on file    Gets together: Not on file    Attends religious service:  Not on file    Active member of club or organization: Not on file    Attends meetings of clubs or organizations: Not on file    Relationship status: Not on file  . Intimate partner violence    Fear of current or ex partner: Not on file    Emotionally abused: Not on file    Physically abused: Not on file    Forced sexual activity: Not on file  Other Topics Concern  . Not on file  Social History Narrative  . Not on file     ROS- All systems are reviewed and negative except as per the HPI above.  Physical Exam: Vitals:   09/16/19 1513  BP: 118/82  Pulse: (!) 101  Weight: (!) 155 kg  Height: 6' (1.829 m)    GEN- The patient is well appearing obese male, alert and oriented x 3 today.   HEENT-head normocephalic, atraumatic, sclera clear, conjunctiva pink, hearing intact, trachea midline. Lungs- Clear to ausculation bilaterally, normal work of breathing Heart- irregular rate and rhythm, no murmurs, rubs or gallops  GI- soft, NT, ND, + BS Extremities- no clubbing, cyanosis, or edema MS- no significant deformity or atrophy Skin- no rash or lesion Psych- euthymic mood, full affect Neuro- strength and sensation are intact   Wt Readings from Last 3 Encounters:  09/16/19 (!) 155 kg  08/28/19 (!) 156.9 kg  08/14/19 (!) 155.2 kg    EKG today demonstrates typical atrial flutter with variable block HR 101, QRS 72, QTc 420  Epic records are reviewed at length today  Assessment and Plan:  1. Typical atrial flutter Patient presented for DCCV and had converted to SR. Unfortunately, he is back in atrial flutter today. We discussed therapeutic options including ablation and patient agreeable. Will refer to EP for ablation consideration.  Patient does report that he missed his dose of Eliquis this AM because he had run out.  Continue Eliquis 5 mg BID Continue diltiazem 120 mg daily Continue Lopressor 25 mg BID Check echocardiogram  This patients CHA2DS2-VASc Score and unadjusted  Ischemic Stroke Rate (% per year) is equal to 0.2 % stroke rate/year from a score of 0  Above score calculated as 1 point each if present [CHF, HTN, DM, Vascular=MI/PAD/Aortic Plaque, Age if 65-74, or Male] Above score calculated as 2 points each if present [Age > 75, or Stroke/TIA/TE]   2. Obesity Body mass index is 46.36 kg/m. Lifestyle modification was discussed and encouraged including regular physical activity and weight reduction.  3. OSA Discussed the importance of treating sleep apnea for maintaining SR long term. Patient reports compliance with CPAP therapy.    Follow up with EP for ablation consideration.    Jorja Loa PA-C Afib Clinic [facility] [address] [place], [place] [address] [phone] 09/16/2019 3:32 PM

## 2019-09-16 NOTE — Patient Instructions (Signed)
Scheduler for Dr. Lovena Le office will be in touch with you regarding consultation

## 2019-09-22 ENCOUNTER — Other Ambulatory Visit: Payer: Self-pay

## 2019-09-22 ENCOUNTER — Ambulatory Visit (HOSPITAL_COMMUNITY)
Admission: RE | Admit: 2019-09-22 | Discharge: 2019-09-22 | Disposition: A | Discharge status: Home / Self Care | Payer: 59 | Source: Ambulatory Visit | Attending: Physician Assistant | Admitting: Physician Assistant

## 2019-09-22 DIAGNOSIS — I480 Paroxysmal atrial fibrillation: Secondary | ICD-10-CM | POA: Insufficient documentation

## 2019-09-22 DIAGNOSIS — I483 Typical atrial flutter: Secondary | ICD-10-CM | POA: Diagnosis not present

## 2019-09-22 DIAGNOSIS — I082 Rheumatic disorders of both aortic and tricuspid valves: Secondary | ICD-10-CM | POA: Insufficient documentation

## 2019-09-22 NOTE — Progress Notes (Signed)
  Echocardiogram 2D Echocardiogram has been performed.  Randa Lynn Dravyn Severs 09/22/2019, 3:51 PM

## 2019-09-25 ENCOUNTER — Encounter (HOSPITAL_COMMUNITY): Payer: Self-pay | Admitting: *Deleted

## 2019-10-23 ENCOUNTER — Encounter: Payer: Self-pay | Admitting: Internal Medicine

## 2019-10-23 ENCOUNTER — Ambulatory Visit (INDEPENDENT_AMBULATORY_CARE_PROVIDER_SITE_OTHER): Payer: 59 | Admitting: Internal Medicine

## 2019-10-23 ENCOUNTER — Other Ambulatory Visit: Payer: Self-pay

## 2019-10-23 DIAGNOSIS — I4892 Unspecified atrial flutter: Secondary | ICD-10-CM | POA: Diagnosis not present

## 2019-10-23 NOTE — Patient Instructions (Addendum)
Medication Instructions:  Your physician recommends that you continue on your current medications as directed. Please refer to the Current Medication list given to you today.  Labwork: None ordered.  Testing/Procedures: None ordered.  Follow-Up:   Upcoming days available for procedures:  November 5, 9, 16, 20, 23 December 2, 4, 9, 10, 14, 17, 21, 23   Any Other Special Instructions Will Be Listed Below (If Applicable).  If you need a refill on your cardiac medications before your next appointment, please call your pharmacy.    Cardiac Ablation Cardiac ablation is a procedure to disable (ablate) a small amount of heart tissue in very specific places. The heart has many electrical connections. Sometimes these connections are abnormal and can cause the heart to beat very fast or irregularly. Ablating some of the problem areas can improve the heart rhythm or return it to normal. Ablation may be done for people who:  Have Wolff-Parkinson-White syndrome.  Have fast heart rhythms (tachycardia).  Have taken medicines for an abnormal heart rhythm (arrhythmia) that were not effective or caused side effects.  Have a high-risk heartbeat that may be life-threatening. During the procedure, a small incision is made in the neck or the groin, and a long, thin, flexible tube (catheter) is inserted into the incision and moved to the heart. Small devices (electrodes) on the tip of the catheter will send out electrical currents. A type of X-ray (fluoroscopy) will be used to help guide the catheter and to provide images of the heart. Tell a health care provider about:  Any allergies you have.  All medicines you are taking, including vitamins, herbs, eye drops, creams, and over-the-counter medicines.  Any problems you or family members have had with anesthetic medicines.  Any blood disorders you have.  Any surgeries you have had.  Any medical conditions you have, such as kidney  failure.  Whether you are pregnant or may be pregnant. What are the risks? Generally, this is a safe procedure. However, problems may occur, including:  Infection.  Bruising and bleeding at the catheter insertion site.  Bleeding into the chest, especially into the sac that surrounds the heart. This is a serious complication.  Stroke or blood clots.  Damage to other structures or organs.  Allergic reaction to medicines or dyes.  Need for a permanent pacemaker if the normal electrical system is damaged. A pacemaker is a small computer that sends electrical signals to the heart and helps your heart beat normally.  The procedure not being fully effective. This may not be recognized until months later. Repeat ablation procedures are sometimes required. What happens before the procedure?  Follow instructions from your health care provider about eating or drinking restrictions.  Ask your health care provider about: ? Changing or stopping your regular medicines. This is especially important if you are taking diabetes medicines or blood thinners. ? Taking medicines such as aspirin and ibuprofen. These medicines can thin your blood. Do not take these medicines before your procedure if your health care provider instructs you not to.  Plan to have someone take you home from the hospital or clinic.  If you will be going home right after the procedure, plan to have someone with you for 24 hours. What happens during the procedure?  To lower your risk of infection: ? Your health care team will wash or sanitize their hands. ? Your skin will be washed with soap. ? Hair may be removed from the incision area.  An IV tube will be  inserted into one of your veins.  You will be given a medicine to help you relax (sedative).  The skin on your neck or groin will be numbed.  An incision will be made in your neck or your groin.  A needle will be inserted through the incision and into a large vein  in your neck or groin.  A catheter will be inserted into the needle and moved to your heart.  Dye may be injected through the catheter to help your surgeon see the area of the heart that needs treatment.  Electrical currents will be sent from the catheter to ablate heart tissue in desired areas. There are three types of energy that may be used to ablate heart tissue: ? Heat (radiofrequency energy). ? Laser energy. ? Extreme cold (cryoablation).  When the necessary tissue has been ablated, the catheter will be removed.  Pressure will be held on the catheter insertion area to prevent excessive bleeding.  A bandage (dressing) will be placed over the catheter insertion area. The procedure may vary among health care providers and hospitals. What happens after the procedure?  Your blood pressure, heart rate, breathing rate, and blood oxygen level will be monitored until the medicines you were given have worn off.  Your catheter insertion area will be monitored for bleeding. You will need to lie still for a few hours to ensure that you do not bleed from the catheter insertion area.  Do not drive for 24 hours or as long as directed by your health care provider. Summary  Cardiac ablation is a procedure to disable (ablate) a small amount of heart tissue in very specific places. Ablating some of the problem areas can improve the heart rhythm or return it to normal.  During the procedure, electrical currents will be sent from the catheter to ablate heart tissue in desired areas. This information is not intended to replace advice given to you by your health care provider. Make sure you discuss any questions you have with your health care provider. Document Released: 04/29/2009 Document Revised: 06/03/2018 Document Reviewed: 10/30/2016 Elsevier Patient Education  2020 [employer].

## 2019-10-23 NOTE — Progress Notes (Signed)
HPI Mr. Shane Hart is referred today by Jorja Loa, PA for evaluation of atrial flutter. He is a morbidly obese man with a h/o HTN who was found to have atrial flutter after presenting to his primary MD's office with a headache. He has been placed on systemic anti-coagulation. He has minimal palpitations and denies chest pain or sob. No syncope. No edema.  No Known Allergies   Current Outpatient Medications  Medication Sig Dispense Refill   APPLE CIDER VINEGAR PO Take 1 tablet by mouth 2 (two) times daily.     diltiazem (CARDIZEM CD) 120 MG 24 hr capsule Take 1 capsule (120 mg total) by mouth daily. 30 capsule 3   Ginger, Zingiber officinalis, (GINGER PO) Take 1 tablet by mouth daily.     Multiple Vitamins-Minerals (ONE-A-DAY MENS 50+) TABS Take 1 tablet by mouth daily.     Omega-3 Fatty Acids (OMEGA-3 EPA FISH OIL PO) Take by mouth.     Specialty Vitamins Products (WEIGHT LOSS DAILY MULTI PO) Take 1 tablet by mouth daily. Weight loss Gummies     apixaban (ELIQUIS) 5 MG TABS tablet Take 1 tablet (5 mg total) by mouth 2 (two) times daily. 60 tablet 6   metoprolol tartrate (LOPRESSOR) 25 MG tablet Take 1 tablet (25 mg total) by mouth 2 (two) times daily. 60 tablet 6   No current facility-administered medications for this visit.      Past Medical History:  Diagnosis Date   A-fib (HCC)     ROS:   All systems reviewed and negative except as noted in the HPI.   Past Surgical History:  Procedure Laterality Date   HERNIA REPAIR  1992   L groin   KNEE SURGERY Left 1979     Family History  Problem Relation Age of Onset   Hypertension Mother    Hypertension Sister      Social History   Socioeconomic History   Marital status: Married    Spouse name: Not on file   Number of children: Not on file   Years of education: Not on file   Highest education level: Not on file  Occupational History   Not on file  Social Needs   Financial resource strain: Not  on file   Food insecurity    Worry: Not on file    Inability: Not on file   Transportation needs    Medical: Not on file    Non-medical: Not on file  Tobacco Use   Smoking status: Never Smoker   Smokeless tobacco: Never Used  Substance and Sexual Activity   Alcohol use: No   Drug use: No   Sexual activity: Yes  Lifestyle   Physical activity    Days per week: Not on file    Minutes per session: Not on file   Stress: Not on file  Relationships   Social connections    Talks on phone: Not on file    Gets together: Not on file    Attends religious service: Not on file    Active member of club or organization: Not on file    Attends meetings of clubs or organizations: Not on file    Relationship status: Not on file   Intimate partner violence    Fear of current or ex partner: Not on file    Emotionally abused: Not on file    Physically abused: Not on file    Forced sexual activity: Not on file  Other Topics Concern  Not on file  Social History Narrative   Not on file     BP 102/70    Pulse 83    Ht 6' (1.829 m)    Wt (!) 342 lb (155.1 kg)    SpO2 98%    BMI 46.38 kg/m   Physical Exam:  Well appearing NAD HEENT: Unremarkable Neck:  No JVD, no thyromegally Lymphatics:  No adenopathy Back:  No CVA tenderness Lungs:  Clear with no wheezes HEART:  Regular rate rhythm, no murmurs, no rubs, no clicks Abd:  soft, positive bowel sounds, no organomegally, no rebound, no guarding Ext:  2 plus pulses, no edema, no cyanosis, no clubbing Skin:  No rashes no nodules Neuro:  CN II through XII intact, motor grossly intact  EKG - atrial flutter with a controlled VR  Assess/Plan: 1. Atrial flutter - I have discussed the treatment options with the patient. The risks/goals/expectations and benefits of EP study and ablation and were discussed and he will call us if he wishes to proceed.  2. Anti-coagulation - he will remain on his xarelto.   Mikle Bosworth.D.

## 2019-10-28 ENCOUNTER — Telehealth: Payer: Self-pay | Admitting: Internal Medicine

## 2019-10-28 DIAGNOSIS — I483 Typical atrial flutter: Secondary | ICD-10-CM

## 2019-10-28 NOTE — Telephone Encounter (Signed)
Patient called to set up his A-Fib surgery.

## 2019-10-29 NOTE — Telephone Encounter (Signed)
Call returned to Pt.  Pt scheduled for aflutter ablation on December 08, 2019.  Work up complete.

## 2019-12-02 ENCOUNTER — Telehealth: Payer: Self-pay | Admitting: Internal Medicine

## 2019-12-02 NOTE — Telephone Encounter (Signed)
New Message:   Pt wants to cancel his Cath for 12-08-19 and reschedule for sometime in February please.

## 2019-12-02 NOTE — Telephone Encounter (Signed)
Returned call to Pt.  Pt does not have enough vacation time at this point for this procedure.  Would like to reschedule in February  Advised this nurse would touch base with him in January to reschedule d/t uncertainty with coronaravirus.  Pt indicates understanding and agreement.

## 2019-12-05 ENCOUNTER — Other Ambulatory Visit: Payer: 59

## 2019-12-05 ENCOUNTER — Other Ambulatory Visit (HOSPITAL_COMMUNITY): Payer: 59

## 2019-12-08 ENCOUNTER — Encounter (HOSPITAL_COMMUNITY): Admission: RE | Payer: Self-pay | Source: Home / Self Care

## 2019-12-08 ENCOUNTER — Ambulatory Visit (HOSPITAL_COMMUNITY): Admission: RE | Admit: 2019-12-08 | Payer: 59 | Source: Home / Self Care | Admitting: Internal Medicine

## 2019-12-08 SURGERY — A-FLUTTER ABLATION

## 2019-12-24 ENCOUNTER — Other Ambulatory Visit (HOSPITAL_COMMUNITY): Payer: Self-pay | Admitting: Physician Assistant

## 2020-01-15 ENCOUNTER — Ambulatory Visit: Payer: 59 | Admitting: Internal Medicine

## 2020-07-01 ENCOUNTER — Ambulatory Visit: Payer: 59 | Admitting: Internal Medicine

## 2020-07-05 ENCOUNTER — Telehealth: Payer: Self-pay | Admitting: Internal Medicine

## 2020-07-05 MED ORDER — METOPROLOL TARTRATE 25 MG PO TABS: 25.0000 mg | ORAL_TABLET | Freq: Two times a day (BID) | ORAL | 6 refills | Status: DC

## 2020-07-05 NOTE — Telephone Encounter (Signed)
RX sent

## 2020-07-05 NOTE — Telephone Encounter (Signed)
New message    *STAT* If patient is at the pharmacy, call can be transferred to refill team.   1. Which medications need to be refilled? (please list name of each medication and dose if known) metoprolol tartrate 25 mg  2. Which pharmacy/location (including street and city if local pharmacy) is medication to be sent to? [facility]  3. Do they need a 30 day or 90 day supply? 30 day supply    Pt states he has 3 pills left

## 2020-07-12 ENCOUNTER — Other Ambulatory Visit: Payer: Self-pay

## 2020-07-12 MED ORDER — DILTIAZEM HCL ER COATED BEADS 120 MG PO CP24: ORAL_CAPSULE | ORAL | 1 refills | Status: DC

## 2020-08-19 ENCOUNTER — Encounter: Payer: Self-pay | Admitting: Internal Medicine

## 2020-08-19 ENCOUNTER — Ambulatory Visit (INDEPENDENT_AMBULATORY_CARE_PROVIDER_SITE_OTHER): Payer: 59 | Admitting: Internal Medicine

## 2020-08-19 ENCOUNTER — Other Ambulatory Visit: Payer: Self-pay

## 2020-08-19 VITALS — BP 126/82 | HR 91 | Ht 73.0 in | Wt 360.0 lb

## 2020-08-19 DIAGNOSIS — I4891 Unspecified atrial fibrillation: Secondary | ICD-10-CM | POA: Diagnosis not present

## 2020-08-19 DIAGNOSIS — I4892 Unspecified atrial flutter: Secondary | ICD-10-CM | POA: Diagnosis not present

## 2020-08-19 MED ORDER — DILTIAZEM HCL ER COATED BEADS 180 MG PO CP24: 180.0000 mg | ORAL_CAPSULE | Freq: Every day | ORAL | 3 refills | Status: DC

## 2020-08-19 NOTE — Progress Notes (Signed)
HPI Shane Hart returns today for followup. He is a pleasant 54 yo man with a h/o atrial flutter who I saw last year for evaluation of atrial flutter. I had recommended catheter ablation but he opted not to proceed. He was kept on xarelto. In the interim, he notes class 2 sob. He denies palpitations. He admits to dietary indiscretion and has gained almost 20 lbs.  No Known Allergies   Current Outpatient Medications  Medication Sig Dispense Refill  . APPLE CIDER VINEGAR PO Take 1 tablet by mouth 2 (two) times daily.    . Cyanocobalamin (VITAMIN B12) 1000 MCG TBCR Take 1 tablet by mouth daily.    . Ginger, Zingiber officinalis, (GINGER PO) Take 150 mg by mouth daily.     . Multiple Vitamins-Minerals (ONE-A-DAY MENS 50+) TABS Take 1 tablet by mouth daily.    . multivitamin (ONE-A-DAY MEN'S) TABS tablet Take 1 tablet by mouth daily.    . Omega-3 Fatty Acids (FISH OIL) 1000 MG CAPS Take 1 tablet by mouth daily.    Marland Kitchen apixaban (ELIQUIS) 5 MG TABS tablet Take 1 tablet (5 mg total) by mouth 2 (two) times daily. 60 tablet 6  . diltiazem (CARDIZEM CD) 180 MG 24 hr capsule Take 1 capsule (180 mg total) by mouth daily. 90 capsule 3  . metoprolol tartrate (LOPRESSOR) 25 MG tablet Take 1 tablet (25 mg total) by mouth 2 (two) times daily. 60 tablet 6   No current facility-administered medications for this visit.     Past Medical History:  Diagnosis Date  . A-fib (HCC)     ROS:   All systems reviewed and negative except as noted in the HPI.   Past Surgical History:  Procedure Laterality Date  . HERNIA REPAIR  1992   L groin  . KNEE SURGERY Left 1979     Family History  Problem Relation Age of Onset  . Hypertension Mother   . Hypertension Sister      Social History   Socioeconomic History  . Marital status: Married    Spouse name: Not on file  . Number of children: Not on file  . Years of education: Not on file  . Highest education level: Not on file  Occupational History    . Not on file  Tobacco Use  . Smoking status: Never Smoker  . Smokeless tobacco: Never Used  Substance and Sexual Activity  . Alcohol use: No  . Drug use: No  . Sexual activity: Yes  Other Topics Concern  . Not on file  Social History Narrative  . Not on file   Social Determinants of Health   Financial Resource Strain:   . Difficulty of Paying Living Expenses: Not on file  Food Insecurity:   . Worried About Programme researcher, broadcasting/film/video in the Last Year: Not on file  . Ran Out of Food in the Last Year: Not on file  Transportation Needs:   . Lack of Transportation (Medical): Not on file  . Lack of Transportation (Non-Medical): Not on file  Physical Activity:   . Days of Exercise per Week: Not on file  . Minutes of Exercise per Session: Not on file  Stress:   . Feeling of Stress : Not on file  Social Connections:   . Frequency of Communication with Friends and Family: Not on file  . Frequency of Social Gatherings with Friends and Family: Not on file  . Attends Religious Services: Not on file  .  Active Member of Clubs or Organizations: Not on file  . Attends Banker Meetings: Not on file  . Marital Status: Not on file  Intimate Partner Violence:   . Fear of Current or Ex-Partner: Not on file  . Emotionally Abused: Not on file  . Physically Abused: Not on file  . Sexually Abused: Not on file     BP 126/82   Pulse 91   Ht 6\' 1"  (1.854 m)   Wt (!) 360 lb (163.3 kg)   SpO2 94%   BMI 47.50 kg/m   Physical Exam:  Well appearing NAD HEENT: Unremarkable Neck:  No JVD, no thyromegally Lymphatics:  No adenopathy Back:  No CVA tenderness Lungs:  Clear HEART:  Regular rate rhythm, no murmurs, no rubs, no clicks Abd:  soft, positive bowel sounds, no organomegally, no rebound, no guarding Ext:  2 plus pulses, no edema, no cyanosis, no clubbing Skin:  No rashes no nodules Neuro:  CN II through XII intact, motor grossly intact  EKG - atrial fib with a  RVR   Assess/Plan: 1. Atrial fib - his rate is not well controlled. I asked him to uptitrate the cardizem to 180 mg daily. 2. Atrial flutter - this has reverted to atrial fib. No indication for ablation at this point. 3. Morbid obesity - this is his biggest problem. I have encouraged him to lose weight. 4. HTN - His bp is fairly well controlled. No other med change.  .

## 2020-08-19 NOTE — Patient Instructions (Addendum)
Medication Instructions:  Your physician has recommended you make the following change in your medication:   1.  Increase your diltiazem ER to 180 mg- Take one capsule by mouth daily  Labwork: None ordered.  Testing/Procedures: None ordered.  Follow-Up: Your physician wants you to follow-up in: 6 months with Dr. Ladona Ridgel.   You will receive a reminder letter in the mail two months in advance. If you don't receive a letter, please call our office to schedule the follow-up appointment.  Any Other Special Instructions Will Be Listed Below (If Applicable).  If you need a refill on your cardiac medications before your next appointment, please call your pharmacy.

## 2020-09-29 ENCOUNTER — Telehealth: Payer: Self-pay | Admitting: Internal Medicine

## 2020-09-29 NOTE — Telephone Encounter (Signed)
Letter created for Pt.  Advised would be ready to pick up tomorrow.

## 2020-09-29 NOTE — Telephone Encounter (Signed)
Pt called in and stated that he is needing a letter stating that he is ok to drive while taking the meds that he has been prescribed.  He stated he is needing this letter for [employer].    Pt stated he could pick it up from the office when it was ready Best number [phone]

## 2020-10-09 ENCOUNTER — Other Ambulatory Visit (HOSPITAL_COMMUNITY): Payer: Self-pay | Admitting: Physician Assistant

## 2020-10-11 NOTE — Telephone Encounter (Signed)
Pt last saw Dr Ladona Ridgel 08/19/20, last labs 05/12/20 Creat 0.86 at [facility] per [facility], age 54, weight 163.3kg, based on specified criteria pt is on appropriate dosage of Eliquis 5mg  BID.  Will refill rx.

## 2020-12-10 ENCOUNTER — Other Ambulatory Visit: Payer: Self-pay | Admitting: Internal Medicine

## 2021-02-02 ENCOUNTER — Other Ambulatory Visit: Payer: Self-pay | Admitting: Internal Medicine

## 2021-02-03 MED ORDER — DILTIAZEM HCL ER COATED BEADS 180 MG PO CP24: 180.0000 mg | ORAL_CAPSULE | Freq: Every day | ORAL | 2 refills | Status: DC

## 2021-02-10 ENCOUNTER — Other Ambulatory Visit: Payer: Self-pay | Admitting: Orthopaedic Surgery

## 2021-02-10 DIAGNOSIS — S86119A Strain of other muscle(s) and tendon(s) of posterior muscle group at lower leg level, unspecified leg, initial encounter: Secondary | ICD-10-CM

## 2021-03-01 ENCOUNTER — Other Ambulatory Visit: Payer: 59

## 2021-03-16 ENCOUNTER — Ambulatory Visit
Admission: RE | Admit: 2021-03-16 | Discharge: 2021-03-16 | Disposition: A | Discharge status: Home / Self Care | Payer: 59 | Source: Ambulatory Visit | Attending: Orthopaedic Surgery | Admitting: Orthopaedic Surgery

## 2021-03-16 ENCOUNTER — Other Ambulatory Visit: Payer: Self-pay

## 2021-03-16 ENCOUNTER — Other Ambulatory Visit: Payer: Self-pay | Admitting: Physician Assistant

## 2021-03-16 DIAGNOSIS — S86119A Strain of other muscle(s) and tendon(s) of posterior muscle group at lower leg level, unspecified leg, initial encounter: Secondary | ICD-10-CM

## 2021-04-27 ENCOUNTER — Other Ambulatory Visit: Payer: Self-pay | Admitting: Internal Medicine

## 2021-04-29 IMAGING — MR MR ANKLE*L* W/O CM
4 of 5 series · 13 of 40 positions shown · non-contrast
Comparison: X-ray 03/10/2012

CLINICAL DATA: Lateral left ankle pain for 6 months

EXAM:
MRI OF THE LEFT ANKLE WITHOUT CONTRAST
TECHNIQUE: Multiplanar, multisequence MR imaging of the ankle was performed. No
intravenous contrast was administered.

[Series 3: PD fat-sat · axial · left · 3.0mm · 0.25mm/px · z∈[-104,+23]mm · 4 of 37 slices shown]
[im 1/37]
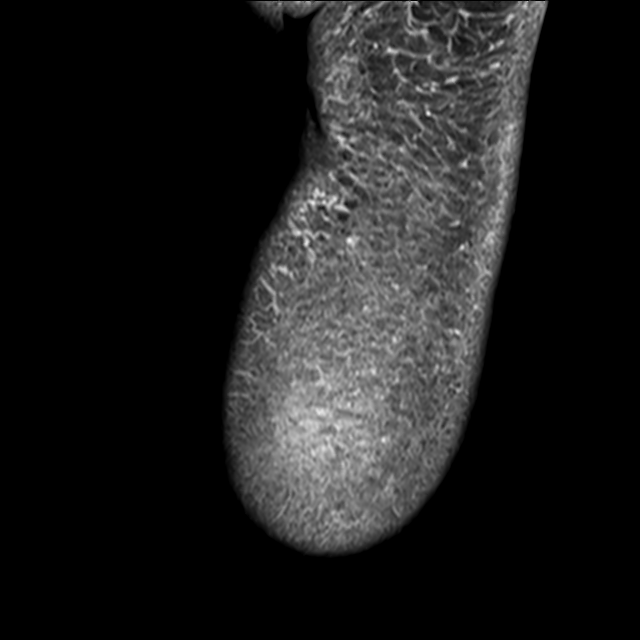
[im 4/37]
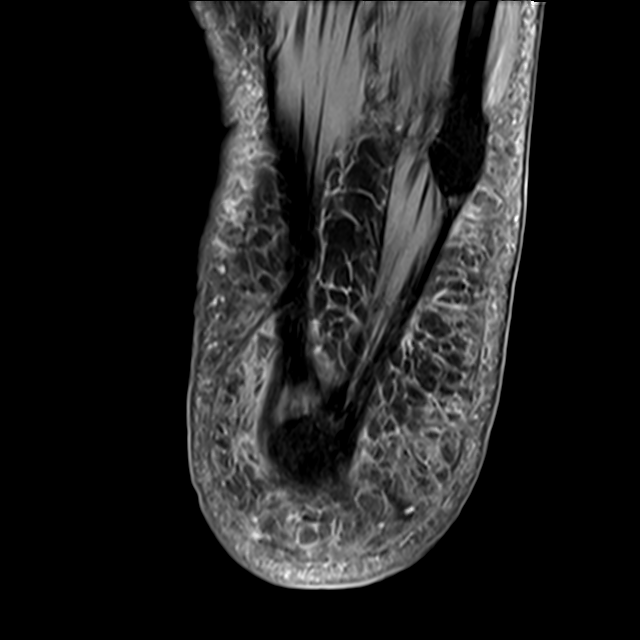
[im 19/37]
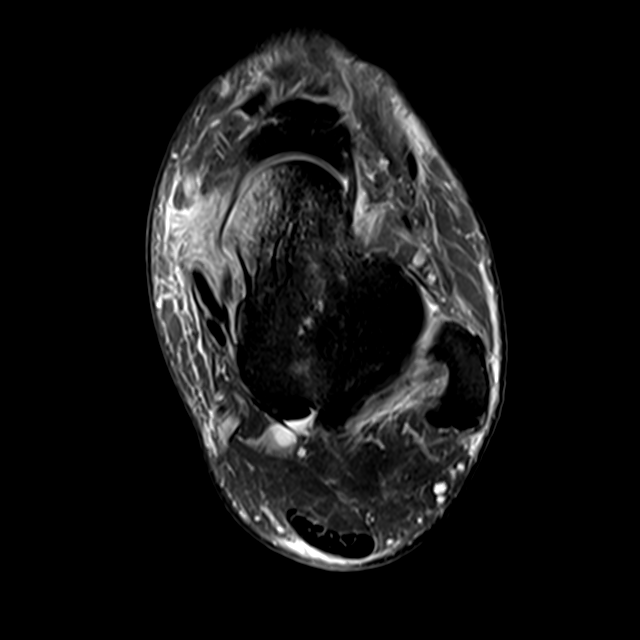
[im 33/37]
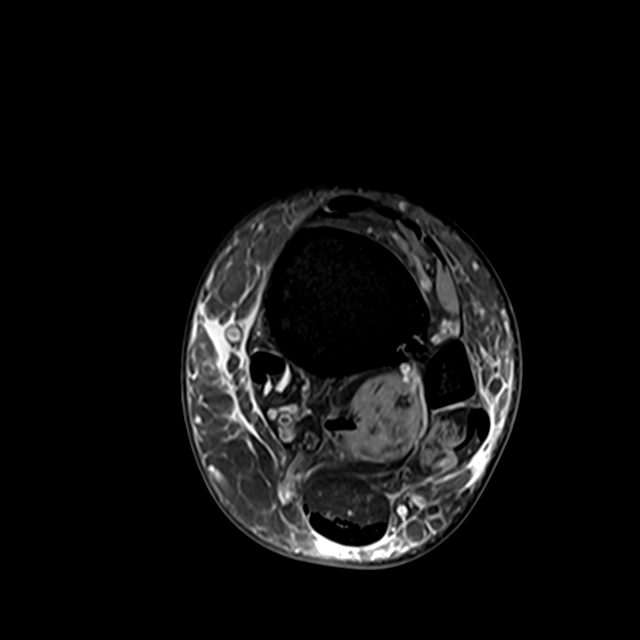

[Series 4: T2 fat-sat · axial · left · 3.0mm · 0.25mm/px · z∈[-74,+5]mm · 3 of 30 slices shown (1 of 2)]
[im 5/30]
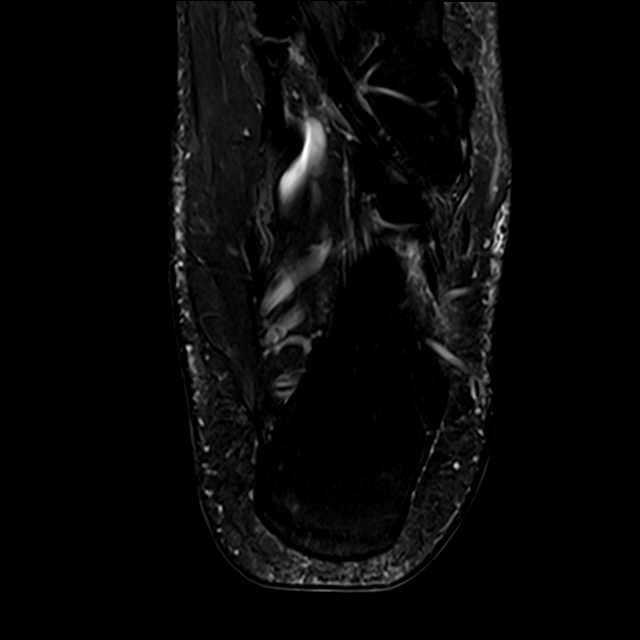
[im 17/30]
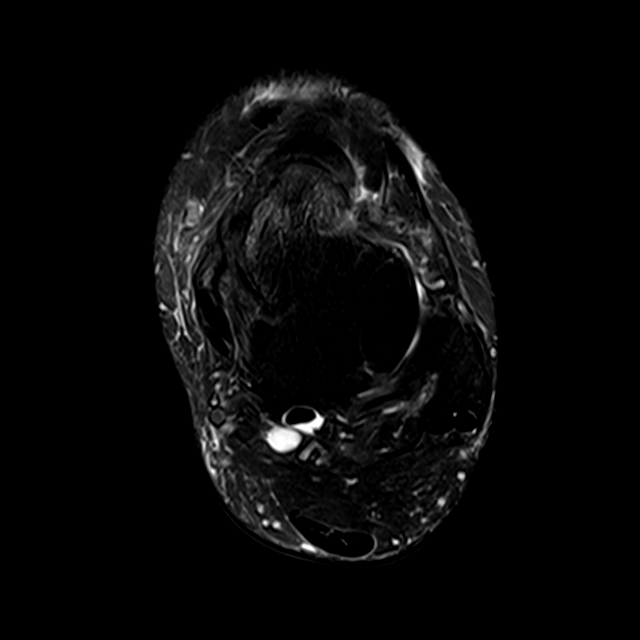
[im 25/30]
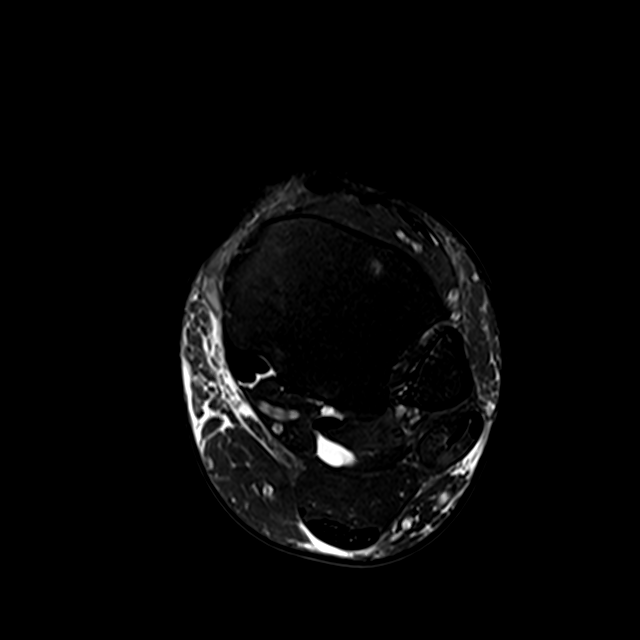

[Series 5: T1 · sagittal · left · 4.0mm · 0.27mm/px · 3 of 24 slices shown]
[im 5/24]
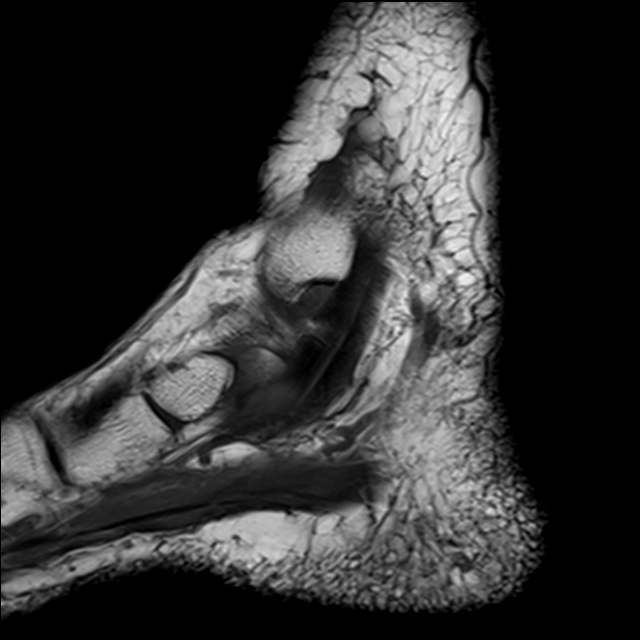
[im 14/24]
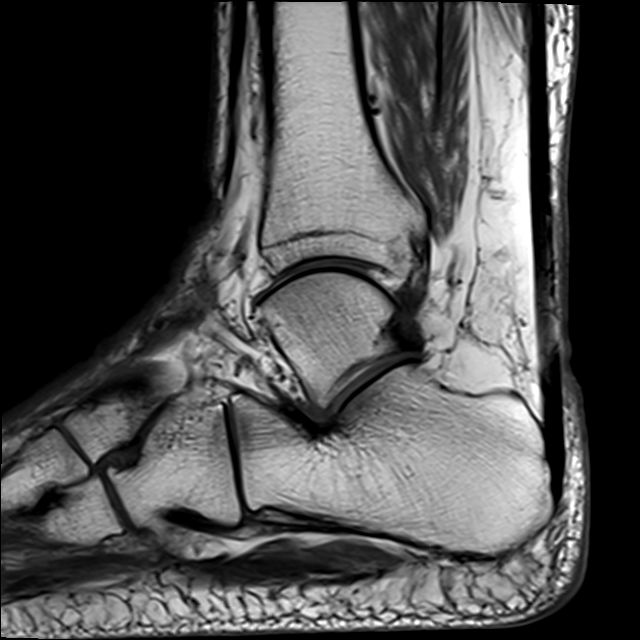
[im 24/24]
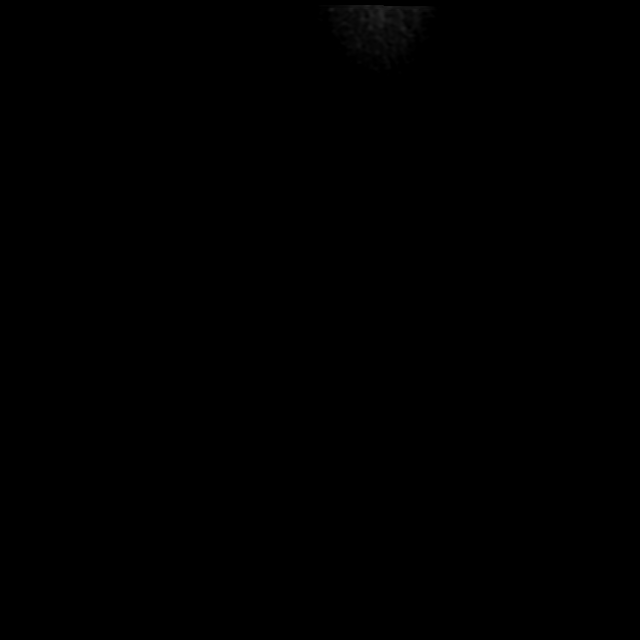

[Series 7: T2 fat-sat · coronal · left · 3.0mm · 0.26mm/px · 3 of 33 slices shown (2 of 2)]
[im 5/33]
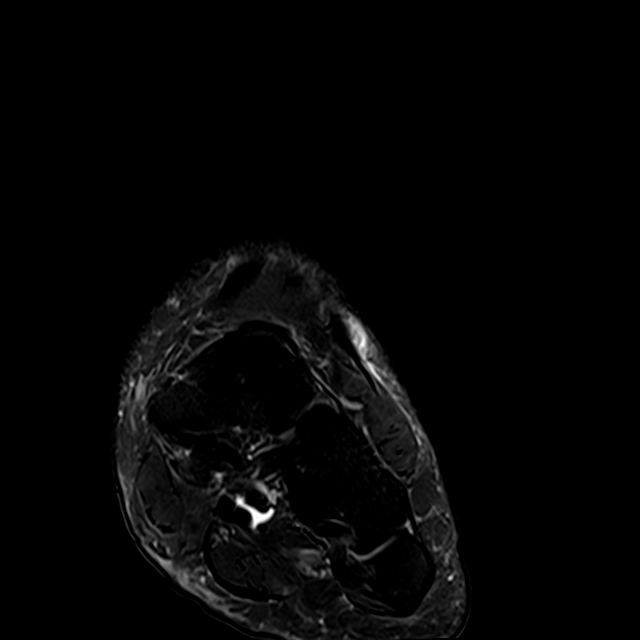
[im 17/33]
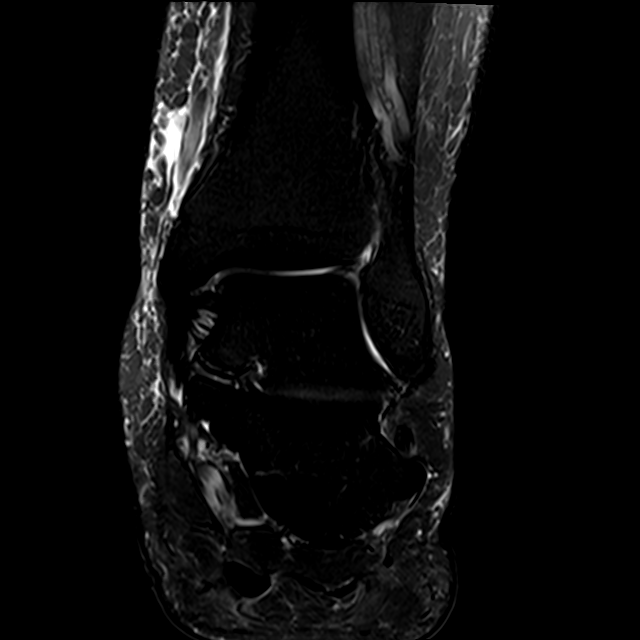
[im 29/33]
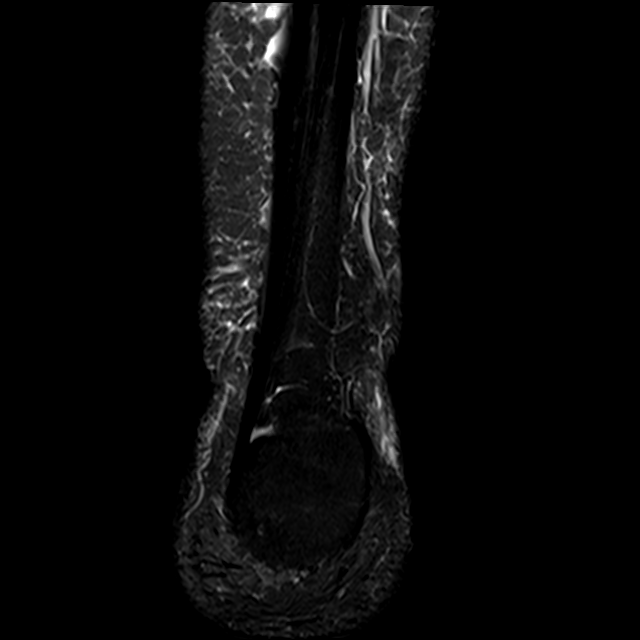

[13 of 40 positions shown; findings below may reference images not displayed]

FINDINGS: TENDONS

Peroneal: Intact peroneus longus and peroneus brevis tendons.

Posteromedial: Mild tendinosis of the distal posterior tibialis
tendon. Intact flexor hallucis longus and flexor digitorum longus
tendons. Small volume tenosynovial fluid located at the knot of
Henry.

Anterior: Intact tibialis anterior, extensor hallucis longus and
extensor digitorum longus tendons.

Achilles: Intact.

Plantar Fascia: Thickening of the central band of the plantar fascia
without tear.

LIGAMENTS

Lateral: Anterior talofibular ligament is significantly attenuated
without evidence of acute tear. Intact posterior talofibular
ligament and calcaneofibular ligament. Anterior and posterior
tibiofibular ligaments intact.

Medial: Degenerated appearance of the superomedial portion of the
spring ligament complex. Intact deltoid ligament.

CARTILAGE

Ankle Joint: No joint effusion or chondral defect.

Subtalar Joints/Sinus Tarsi: No joint effusion or chondral defect.
Mild edema within the sinus tarsi, although there is preservation of
the anatomic fat.

Bones: Prominent bone marrow edema throughout the talar head and
neck. No well-defined fracture line. Remaining osseous structures
are intact. Hindfoot valgus alignment. No bone lesion.

Other: Mild diffuse nonspecific subcutaneous edema.
IMPRESSION: 1. Prominent bone marrow edema throughout the talar head and neck.
No well-defined fracture line. Findings may reflect bone contusion
versus stress related changes or altered mechanics.
2. Mild tendinosis of the distal posterior tibialis tendon.
3. Hindfoot valgus alignment.
4. Findings suggest remote ATFL injury.
5. Thickening of the central band of the plantar fascia without
tear. Findings may reflect sequela of plantar fasciitis.

## 2021-06-01 ENCOUNTER — Other Ambulatory Visit: Payer: Self-pay | Admitting: Internal Medicine

## 2021-06-01 ENCOUNTER — Other Ambulatory Visit (HOSPITAL_COMMUNITY): Payer: Self-pay | Admitting: Internal Medicine

## 2021-06-01 NOTE — Telephone Encounter (Signed)
Eliquis 5mg  refill request received. Patient is 55 years old, weight-163.3kg, Crea-0.94 on 05/30/2021 via [facility] from [place], Diagnosis-atrial flutter & afib, and last seen by Dr. Natrona heights on 08/19/2020. Dose is appropriate based on dosing criteria. Will send in refill to requested pharmacy.

## 2021-09-22 ENCOUNTER — Other Ambulatory Visit: Payer: Self-pay | Admitting: *Deleted

## 2021-09-22 MED ORDER — METOPROLOL TARTRATE 25 MG PO TABS: 25.0000 mg | ORAL_TABLET | Freq: Two times a day (BID) | ORAL | 0 refills | Status: DC

## 2021-10-25 ENCOUNTER — Other Ambulatory Visit: Payer: Self-pay | Admitting: Internal Medicine

## 2021-12-05 ENCOUNTER — Telehealth: Payer: Self-pay | Admitting: *Deleted

## 2021-12-05 DIAGNOSIS — Z86010 Personal history of colonic polyps: Secondary | ICD-10-CM | POA: Insufficient documentation

## 2021-12-05 DIAGNOSIS — G43909 Migraine, unspecified, not intractable, without status migrainosus: Secondary | ICD-10-CM | POA: Insufficient documentation

## 2021-12-05 DIAGNOSIS — G4733 Obstructive sleep apnea (adult) (pediatric): Secondary | ICD-10-CM | POA: Insufficient documentation

## 2021-12-05 NOTE — Telephone Encounter (Signed)
Patient with diagnosis of afib on Eliquis for anticoagulation.    Procedure: colonoscopy Date of procedure: 02/01/22  CHA2DS2-VASc Score = 0  This indicates a 0.2% annual risk of stroke. The patient's score is based upon: CHF History: 0 HTN History: 0 Diabetes History: 0 Stroke History: 0 Vascular Disease History: 0 Age Score: 0 Gender Score: 0     CrCl >164mL/min Platelet count 211K  Pt last seen Aug 2021 and was supposed to follow up 6 months later. He has not been seen since then and currently has no appts scheduled. Assuming no clinical changes at future office visit before procedure, pt can hold Eliquis for 2 days prior as requested.

## 2021-12-05 NOTE — Telephone Encounter (Signed)
Clinical pharmacist to review Eliquis 

## 2021-12-05 NOTE — Telephone Encounter (Signed)
   Pre-operative Risk Assessment    Patient Name: Shane Hart  DOB: 05/18/1966 MRN: [id number]      Request for Surgical Clearance    Procedure:   COLONOSCOPY  Date of Surgery:  Clearance 02/01/22                                 Surgeon:  DR. [facility] Surgeon's Group or Practice Name:  [facility] Phone number:  [phone] Fax number:  [phone]   Type of Clearance Requested:   - Medical  - Pharmacy:  Hold Apixaban (Eliquis) x 2 DAYS PRIOR   Type of Anesthesia:   PROPOFOL   Additional requests/questions:    [name]   12/05/2021, 2:03 PM

## 2021-12-06 NOTE — Telephone Encounter (Signed)
° °  Name: Shane Hart  DOB: 25-Jul-1966  MRN: [id number]  Primary Cardiologist: Dr. Ladona Ridgel  Chart reviewed as part of pre-operative protocol coverage. Because of Ruari Mudgett past medical history and time since last visit, he will require a follow-up visit in order to better assess preoperative cardiovascular risk.  Pre-op covering staff: - Please schedule appointment and call patient to inform them. If patient already had an upcoming appointment within acceptable timeframe, please add "pre-op clearance" to the appointment notes so provider is aware. - Please contact requesting surgeon's office via preferred method (i.e, phone, fax) to inform them of need for appointment prior to surgery.  If applicable, this message will also be routed to pharmacy pool and/or primary cardiologist for input on holding anticoagulant/antiplatelet agent as requested below so that this information is available to the clearing provider at time of patient's appointment.   [place], [place]  12/06/2021, 10:46 PM

## 2021-12-07 NOTE — Telephone Encounter (Signed)
Pt has been scheduled to see Otilio Saber, EP NP, 01/24/2022 and clearance will be addressed at that time.  Will route back to the requesting surgeon's office to make them aware.

## 2021-12-27 ENCOUNTER — Other Ambulatory Visit: Payer: Self-pay | Admitting: *Deleted

## 2021-12-27 MED ORDER — DILTIAZEM HCL ER COATED BEADS 180 MG PO CP24: 180.0000 mg | ORAL_CAPSULE | Freq: Every day | ORAL | 0 refills | Status: DC

## 2021-12-29 ENCOUNTER — Other Ambulatory Visit: Payer: Self-pay

## 2021-12-29 MED ORDER — METOPROLOL TARTRATE 25 MG PO TABS: ORAL_TABLET | ORAL | 0 refills | Status: DC

## 2022-01-23 NOTE — Progress Notes (Signed)
PCP:  Leone Brand, NP Primary Cardiologist: None Electrophysiologist: Lewayne Bunting, MD   Shane Hart is a 56 y.o. male seen today for Lewayne Bunting, MD for cardiac clearance for colonoscopy.  Since last being seen in our clinic the patient reports doing very well.  he denies chest pain, palpitations, dyspnea, PND, orthopnea, nausea, vomiting, dizziness, syncope, edema, weight gain, or early satiety.  Past Medical History:  Diagnosis Date   A-fib [facility])    Past Surgical History:  Procedure Laterality Date   HERNIA REPAIR  1992   L groin   KNEE SURGERY Left 1979    Current Outpatient Medications  Medication Sig Dispense Refill   APPLE CIDER VINEGAR PO Take 1 tablet by mouth 2 (two) times daily.     Cyanocobalamin (VITAMIN B12) 1000 MCG TBCR Take 1 tablet by mouth daily.     diltiazem (CARDIZEM CD) 180 MG 24 hr capsule Take 1 capsule (180 mg total) by mouth daily. 30 capsule 0   ELIQUIS 5 MG TABS tablet TAKE 1 TABLET BY MOUTH TWICE DAILY 60 tablet 6   Ginger, Zingiber officinalis, (GINGER PO) Take 150 mg by mouth daily.      metoprolol tartrate (LOPRESSOR) 25 MG tablet TAKE 1 TABLET BY MOUTH TWICE DAILY. Please keep upcoming appt in January 2023 with Cardiologist before anymore refills. Thank you Final Attempt 60 tablet 0   Multiple Vitamins-Minerals (ONE-A-DAY MENS 50+) TABS Take 1 tablet by mouth daily.     multivitamin (ONE-A-DAY MEN'S) TABS tablet Take 1 tablet by mouth daily.     Omega-3 Fatty Acids (FISH OIL) 1000 MG CAPS Take 1 tablet by mouth daily.     No current facility-administered medications for this visit.    No Known Allergies  Social History   Socioeconomic History   Marital status: Married    Spouse name: Not on file   Number of children: Not on file   Years of education: Not on file   Highest education level: Not on file  Occupational History   Not on file  Tobacco Use   Smoking status: Never   Smokeless tobacco: Never  Substance and Sexual  Activity   Alcohol use: No   Drug use: No   Sexual activity: Yes  Other Topics Concern   Not on file  Social History Narrative   Not on file   Social Determinants of Health   Financial Resource Strain: Not on file  Food Insecurity: Not on file  Transportation Needs: Not on file  Physical Activity: Not on file  Stress: Not on file  Social Connections: Not on file  Intimate Partner Violence: Not on file     Review of Systems: All other systems reviewed and are otherwise negative except as noted above.  Physical Exam: Vitals:   01/24/22 1015  BP: 116/74  SpO2: 98%  Weight: (!) 372 lb 6.4 oz (168.9 kg)  Height: 6\' 1"  (1.854 m)    GEN- The patient is well appearing, alert and oriented x 3 today.   HEENT: normocephalic, atraumatic; sclera clear, conjunctiva pink; hearing intact; oropharynx clear; neck supple, no JVP Lymph- no cervical lymphadenopathy Lungs- Clear to ausculation bilaterally, normal work of breathing.  No wheezes, rales, rhonchi Heart- Regular rate and rhythm, no murmurs, rubs or gallops, PMI not laterally displaced GI- soft, non-tender, non-distended, bowel sounds present, no hepatosplenomegaly Extremities- no clubbing, cyanosis, or edema; DP/PT/radial pulses 2+ bilaterally MS- no significant deformity or atrophy Skin- warm and dry, no rash or lesion  Psych- euthymic mood, full affect Neuro- strength and sensation are intact  EKG is ordered. Personal review of EKG from today shows AF at 79 bpm  Additional studies reviewed include: Previous EP office notes.   Assessment and Plan:  1. Atrial fib -  EKG today shows rate controlled atrial fibrillation at 79   Continue diltiazem 180 mg daily.  2. Atrial flutter -  Also has history of this.  No indication for ablation at this point.  3. Morbid obesity -  Body mass index is 49.13 kg/m.  4. HTN Stable on current regimen  5. Cardiac clearance for colonoscopy Echo 08/2019 LVEF 55-60% Overall his risk  from a cardiac perspective is Low by Revised Cardiac Risk Index Nedra Hai Criteria).  He understands risk of worsened atrial rates during prep and peri-operatively. He understands risk stroke while holding anticoagulation. May hold Eliquis for 2 days as requested per Pharm-D review.  Follow up with Dr. Ladona Ridgel in 12 months   Graciella Freer, PA-C  01/24/22 10:34 AM

## 2022-01-24 ENCOUNTER — Other Ambulatory Visit: Payer: Self-pay

## 2022-01-24 ENCOUNTER — Encounter: Payer: Self-pay | Admitting: Student

## 2022-01-24 ENCOUNTER — Ambulatory Visit (INDEPENDENT_AMBULATORY_CARE_PROVIDER_SITE_OTHER): Payer: 59 | Admitting: Student

## 2022-01-24 VITALS — BP 116/74 | Ht 73.0 in | Wt 372.4 lb

## 2022-01-24 DIAGNOSIS — I4891 Unspecified atrial fibrillation: Secondary | ICD-10-CM | POA: Diagnosis not present

## 2022-01-24 DIAGNOSIS — I10 Essential (primary) hypertension: Secondary | ICD-10-CM | POA: Diagnosis not present

## 2022-01-24 DIAGNOSIS — I4892 Unspecified atrial flutter: Secondary | ICD-10-CM | POA: Diagnosis not present

## 2022-01-24 LAB — CBC
Hematocrit: 38.9 % (ref 37.5–51.0)
Hemoglobin: 12.5 g/dL — ABNORMAL LOW (ref 13.0–17.7)
MCH: 28 pg (ref 26.6–33.0)
MCHC: 32.1 g/dL (ref 31.5–35.7)
MCV: 87 fL (ref 79–97)
Platelets: 231 10*3/uL (ref 150–450)
RBC: 4.47 x10E6/uL (ref 4.14–5.80)
RDW: 13 % (ref 11.6–15.4)
WBC: 4.1 10*3/uL (ref 3.4–10.8)

## 2022-01-24 LAB — BASIC METABOLIC PANEL
BUN/Creatinine Ratio: 16 (ref 9–20)
BUN: 15 mg/dL (ref 6–24)
CO2: 26 mmol/L (ref 20–29)
Calcium: 9.2 mg/dL (ref 8.7–10.2)
Chloride: 104 mmol/L (ref 96–106)
Creatinine, Ser: 0.95 mg/dL (ref 0.76–1.27)
Glucose: 96 mg/dL (ref 70–99)
Potassium: 4.5 mmol/L (ref 3.5–5.2)
Sodium: 142 mmol/L (ref 134–144)
eGFR: 94 mL/min/{1.73_m2} (ref >59)

## 2022-01-24 NOTE — Patient Instructions (Signed)
Medication Instructions:  Your physician recommends that you continue on your current medications as directed. Please refer to the Current Medication list given to you today.  *If you need a refill on your cardiac medications before your next appointment, please call your pharmacy*   Lab Work: TODAY: BMET, CBC  If you have labs (blood work) drawn today and your tests are completely normal, you will receive your results only by: MyChart Message (if you have MyChart) OR A paper copy in the mail If you have any lab test that is abnormal or we need to change your treatment, we will call you to review the results.   Follow-Up: At [facility], you and your health needs are our priority.  As part of our continuing mission to provide you with exceptional heart care, we have created designated Provider Care Teams.  These Care Teams include your primary Cardiologist (physician) and Advanced Practice Providers (APPs -  Physician Assistants and Nurse Practitioners) who all work together to provide you with the care you need, when you need it.  We recommend signing up for the patient portal called "MyChart".  Sign up information is provided on this After Visit Summary.  MyChart is used to connect with patients for Virtual Visits (Telemedicine).  Patients are able to view lab/test results, encounter notes, upcoming appointments, etc.  Non-urgent messages can be sent to your provider as well.   To learn more about what you can do with MyChart, go to [email or web address].    Your next appointment:   1 year(s)  The format for your next appointment:   In Person  Provider:   You may see Gregg Taylor, MD or one of the following Advanced Practice Providers on your designated Care Team:   Renee Ursuy, PA-C Michael "Andy" [place], PA-C  

## 2022-01-30 NOTE — Telephone Encounter (Signed)
° ° °  Patient Name: Shane Hart  DOB: 1966/01/17 MRN: [id number]  Primary Cardiologist: None  Chart reviewed as part of pre-operative protocol coverage. Patient was seen by Graciella Freer, PA-C, on 01/24/2022 at which time pre-op evaluation was addressed. Per his note: Cardiac Clearance for Colonoscopy Echo 08/2019 LVEF 55-60%. Overall his risk from a cardiac perspective is Low by Revised Cardiac Risk Index Nedra Hai Criteria). He understands risk of worsened atrial rates during prep and peri-operatively. He understands risk stroke while holding anticoagulation. May hold Eliquis for 2 days as requested per Pharm-D review.  I will route this recommendation to the requesting party via Epic fax function and remove from pre-op pool.  Please call with questions.  Corrin Parker, PA-C 01/30/2022, 8:25 AM

## 2022-03-06 ENCOUNTER — Other Ambulatory Visit: Payer: Self-pay | Admitting: *Deleted

## 2022-03-06 MED ORDER — METOPROLOL TARTRATE 25 MG PO TABS: ORAL_TABLET | ORAL | 3 refills | Status: DC

## 2022-03-07 ENCOUNTER — Other Ambulatory Visit: Payer: Self-pay

## 2022-03-07 MED ORDER — APIXABAN 5 MG PO TABS: 5.0000 mg | ORAL_TABLET | Freq: Two times a day (BID) | ORAL | 10 refills | Status: DC

## 2022-03-07 NOTE — Telephone Encounter (Signed)
Pt last saw Otilio Saber, [place] 01/24/22, last labs 01/24/22 Creat 0.95, age 56, weight 168.9kg, based on specified criteria pt is on appropriate dosage of Eliquis 5mg  BId for afib/aflutter.  Will refill rx.  ?

## 2022-04-20 ENCOUNTER — Telehealth: Payer: Self-pay

## 2022-04-20 NOTE — Telephone Encounter (Signed)
Received message from [facility] pharmacy that Pt's Eliquis was not covered by his plan. ? ?Outreach  made to Pt.  Per Pt he has a new insurance card that he needs to give to [employer]. ? ?Advised to submit new insurance card to [facility] and if we need to change his OAC we can. ? ?Advised him to call if he has any further issues. ? ?

## 2022-05-03 ENCOUNTER — Telehealth: Payer: Self-pay

## 2022-05-03 NOTE — Telephone Encounter (Signed)
**Note De-Identified Shane Hart Obfuscation** Eliquis PA started through covermymeds. ? ?Shane Hart (Key: [place]) [id number] ?Rx #: [id number] ?Eliquis 5MG  tablets ?  ?Form: PA Form [phone] NCPDP) ?Determination: Favorable ? ?Message from Plan ?Prior Auth;Coverage Start Date:05/03/2022;Coverage End Date:05/03/2023 ? ?I have notified [id number] - [place], [place] - [address] AT [facility] OF [address] (Ph: [phone]) of this approval. ?

## 2022-07-28 ENCOUNTER — Other Ambulatory Visit: Payer: Self-pay | Admitting: Internal Medicine

## 2023-03-08 ENCOUNTER — Other Ambulatory Visit: Payer: Self-pay | Admitting: Internal Medicine

## 2023-03-08 DIAGNOSIS — I4892 Unspecified atrial flutter: Secondary | ICD-10-CM

## 2023-03-08 MED ORDER — METOPROLOL TARTRATE 25 MG PO TABS: ORAL_TABLET | ORAL | 0 refills | Status: DC

## 2023-03-08 NOTE — Telephone Encounter (Addendum)
Eliquis '5mg'$  refill request received. Patient is 57 years old, weight-168.9kg, Crea-1.07 on 06/01/22 via [place] from [place], Diagnosis-Aflutter, and last seen by Oda Kilts on 01/24/22. Dose is appropriate based on dosing criteria.   Pt needs appt

## 2023-03-08 NOTE — Telephone Encounter (Signed)
Pt has scheduled appt with Dr Lovena Le on 06/05/23. Refill sent.

## 2023-03-08 NOTE — Telephone Encounter (Signed)
Prescription refill request for Eliquis received. Indication: Afib Last office visit: 01/24/22 (Tillery)  Scr: 0.95 (01/24/22)  Age: 57 Weight: 168.9kg

## 2023-05-04 ENCOUNTER — Other Ambulatory Visit: Payer: Self-pay | Admitting: Internal Medicine

## 2023-05-06 ENCOUNTER — Other Ambulatory Visit: Payer: Self-pay | Admitting: Internal Medicine

## 2023-05-08 ENCOUNTER — Other Ambulatory Visit (HOSPITAL_COMMUNITY): Payer: Self-pay

## 2023-05-14 ENCOUNTER — Other Ambulatory Visit (HOSPITAL_COMMUNITY): Payer: Self-pay

## 2023-06-05 ENCOUNTER — Ambulatory Visit: Payer: Commercial Managed Care - HMO | Attending: Internal Medicine | Admitting: Internal Medicine

## 2023-06-05 ENCOUNTER — Encounter: Payer: Self-pay | Admitting: Internal Medicine

## 2023-06-05 VITALS — BP 132/90 | HR 61 | Ht 73.0 in | Wt 375.0 lb

## 2023-06-05 DIAGNOSIS — Z760 Encounter for issue of repeat prescription: Secondary | ICD-10-CM

## 2023-06-05 DIAGNOSIS — I10 Essential (primary) hypertension: Secondary | ICD-10-CM | POA: Diagnosis not present

## 2023-06-05 DIAGNOSIS — I4892 Unspecified atrial flutter: Secondary | ICD-10-CM | POA: Diagnosis not present

## 2023-06-05 DIAGNOSIS — I4891 Unspecified atrial fibrillation: Secondary | ICD-10-CM

## 2023-06-05 NOTE — Patient Instructions (Addendum)
Medication Instructions:  Your physician recommends that you continue on your current medications as directed. Please refer to the Current Medication list given to you today.  *If you need a refill on your cardiac medications before your next appointment, please call your pharmacy*  Lab Work: You will have a CBC and BMET drawn today.    If you have labs (blood work) drawn today and your tests are completely normal, you will receive your results only by: MyChart Message (if you have MyChart) OR A paper copy in the mail If you have any lab test that is abnormal or we need to change your treatment, we will call you to review the results.  Testing/Procedures: None ordered.  Follow-Up: At [facility], you and your health needs are our priority.  As part of our continuing mission to provide you with exceptional heart care, we have created designated Provider Care Teams.  These Care Teams include your primary Cardiologist (physician) and Advanced Practice Providers (APPs -  Physician Assistants and Nurse Practitioners) who all work together to provide you with the care you need, when you need it.  Your next appointment:   1 year(s)  The format for your next appointment:   In Person  Provider:   Lewayne Bunting, MD{or one of the following Advanced Practice Providers on your designated Care Team:   Francis Dowse, [place] Casimiro Needle "[facility]" [place], [place]

## 2023-06-05 NOTE — Progress Notes (Signed)
HPI Mr. Shane Hart returns today for followup. He is a pleasant 57 yo man with a h/o atrial fib and flutter. He has been on eliquis for stroke prevention. In the interim, he notes class 2 sob. He denies palpitations. He admits to dietary indiscretion and has gained almost 15 lbs. Since his last visit. No chest pain.  No Known Allergies   Current Outpatient Medications  Medication Sig Dispense Refill   apixaban (ELIQUIS) 5 MG TABS tablet TAKE 1 TABLET(5 MG) BY MOUTH TWICE DAILY 60 tablet 5   APPLE CIDER VINEGAR PO Take 1 tablet by mouth 2 (two) times daily.     Cyanocobalamin (VITAMIN B12) 1000 MCG TBCR Take 1 tablet by mouth daily.     diltiazem (CARDIZEM CD) 180 MG 24 hr capsule TAKE 1 CAPSULE(180 MG) BY MOUTH DAILY 30 capsule 5   Ginger, Zingiber officinalis, (GINGER PO) Take 150 mg by mouth daily.      metoprolol tartrate (LOPRESSOR) 25 MG tablet TAKE 1 TABLET BY MOUTH TWICE DAILY 60 tablet 0   Multiple Vitamins-Minerals (ONE-A-DAY MENS 50+) TABS Take 1 tablet by mouth daily.     multivitamin (ONE-A-DAY MEN'S) TABS tablet Take 1 tablet by mouth daily.     Omega-3 Fatty Acids (FISH OIL) 1000 MG CAPS Take 1 tablet by mouth daily.     No current facility-administered medications for this visit.     Past Medical History:  Diagnosis Date   A-fib (HCC)     ROS:   All systems reviewed and negative except as noted in the HPI.   Past Surgical History:  Procedure Laterality Date   HERNIA REPAIR  1992   L groin   KNEE SURGERY Left 1979     Family History  Problem Relation Age of Onset   Hypertension Mother    Hypertension Sister      Social History   Socioeconomic History   Marital status: Married    Spouse name: Not on file   Number of children: Not on file   Years of education: Not on file   Highest education level: Not on file  Occupational History   Not on file  Tobacco Use   Smoking status: Never   Smokeless tobacco: Never  Substance and Sexual Activity    Alcohol use: No   Drug use: No   Sexual activity: Yes  Other Topics Concern   Not on file  Social History Narrative   Not on file   Social Determinants of Health   Financial Resource Strain: Not on file  Food Insecurity: Not on file  Transportation Needs: Not on file  Physical Activity: Not on file  Stress: Not on file  Social Connections: Not on file  Intimate Partner Violence: Not on file     BP (!) 132/90   Pulse 61   Ht 6\' 1"  (1.854 m)   Wt (!) 375 lb (170.1 kg)   SpO2 98%   BMI 49.48 kg/m   Physical Exam:  Well appearing NAD HEENT: Unremarkable Neck:  No JVD, no thyromegally Lymphatics:  No adenopathy Back:  No CVA tenderness Lungs:  Clear HEART:  Regular rate rhythm, no murmurs, no rubs, no clicks Abd:  soft, positive bowel sounds, no organomegally, no rebound, no guarding Ext:  2 plus pulses, no edema, no cyanosis, no clubbing Skin:  No rashes no nodules Neuro:  CN II through XII intact, motor grossly intact  EKG - atrial fib with a controlled VR  Assess/Plan:  1. Atrial fib - his rate is well controlled. He will continue cardizem and metoprolol 2. Atrial flutter - this has reverted to atrial fib. No indication for ablation at this point. 3. Morbid obesity - this is his biggest problem. I have encouraged him to lose weight. I asked him to reach out to his primary. He may be pre-diabetic. If so he might qualify for a weight loss drug. 4. HTN - His bp is fairly well controlled. No other med change.   Sharlot Gowda Sigrid Schwebach,MD

## 2023-06-06 LAB — CBC WITH DIFFERENTIAL/PLATELET
Basophils Absolute: 0 10*3/uL (ref 0.0–0.2)
Basos: 0 %
EOS (ABSOLUTE): 0.2 10*3/uL (ref 0.0–0.4)
Eos: 5 %
Hematocrit: 41.3 % (ref 37.5–51.0)
Hemoglobin: 13.1 g/dL (ref 13.0–17.7)
Immature Grans (Abs): 0 10*3/uL (ref 0.0–0.1)
Immature Granulocytes: 0 %
Lymphocytes Absolute: 1.7 10*3/uL (ref 0.7–3.1)
Lymphs: 36 %
MCH: 27.7 pg (ref 26.6–33.0)
MCHC: 31.7 g/dL (ref 31.5–35.7)
MCV: 87 fL (ref 79–97)
Monocytes Absolute: 0.4 10*3/uL (ref 0.1–0.9)
Monocytes: 9 %
Neutrophils Absolute: 2.3 10*3/uL (ref 1.4–7.0)
Neutrophils: 50 %
Platelets: 255 10*3/uL (ref 150–450)
RBC: 4.73 x10E6/uL (ref 4.14–5.80)
RDW: 12.2 % (ref 11.6–15.4)
WBC: 4.6 10*3/uL (ref 3.4–10.8)

## 2023-06-06 LAB — BASIC METABOLIC PANEL
BUN/Creatinine Ratio: 16 (ref 9–20)
BUN: 14 mg/dL (ref 6–24)
CO2: 23 mmol/L (ref 20–29)
Calcium: 9.2 mg/dL (ref 8.7–10.2)
Chloride: 106 mmol/L (ref 96–106)
Creatinine, Ser: 0.89 mg/dL (ref 0.76–1.27)
Glucose: 97 mg/dL (ref 70–99)
Potassium: 4.6 mmol/L (ref 3.5–5.2)
Sodium: 141 mmol/L (ref 134–144)
eGFR: 100 mL/min/{1.73_m2} (ref >59)

## 2023-06-21 ENCOUNTER — Other Ambulatory Visit: Payer: Self-pay | Admitting: Internal Medicine

## 2023-07-28 ENCOUNTER — Other Ambulatory Visit: Payer: Self-pay | Admitting: Internal Medicine

## 2023-08-07 ENCOUNTER — Other Ambulatory Visit: Payer: Self-pay

## 2023-08-07 MED ORDER — DILTIAZEM HCL ER COATED BEADS 180 MG PO CP24: 180.0000 mg | ORAL_CAPSULE | Freq: Every day | ORAL | 3 refills | Status: DC

## 2023-11-01 ENCOUNTER — Other Ambulatory Visit: Payer: Self-pay | Admitting: Internal Medicine

## 2023-11-01 DIAGNOSIS — I4892 Unspecified atrial flutter: Secondary | ICD-10-CM

## 2023-11-01 NOTE — Telephone Encounter (Signed)
Eliquis 5mg  refill request received. Patient is 57 years old, weight-170.1kg, Crea-0.89 on 06/05/23, Diagnosis-Afib, and last seen by Dr. Ladona Ridgel on 06/05/23. Dose is appropriate based on dosing criteria. Will send in refill to requested pharmacy.

## 2024-07-21 ENCOUNTER — Other Ambulatory Visit: Payer: Self-pay | Admitting: Internal Medicine

## 2024-07-21 DIAGNOSIS — I4892 Unspecified atrial flutter: Secondary | ICD-10-CM

## 2024-07-21 NOTE — Telephone Encounter (Signed)
 Prescription refill request for Eliquis  received. Indication:afib Last office visit:needs appt Scr:na Age: 58 Weight:170.1  kg  Prescription refilled

## 2024-08-13 ENCOUNTER — Other Ambulatory Visit: Payer: Self-pay | Admitting: Internal Medicine

## 2024-09-01 ENCOUNTER — Emergency Department (HOSPITAL_BASED_OUTPATIENT_CLINIC_OR_DEPARTMENT_OTHER)

## 2024-09-01 ENCOUNTER — Other Ambulatory Visit: Payer: Self-pay

## 2024-09-01 ENCOUNTER — Encounter (HOSPITAL_BASED_OUTPATIENT_CLINIC_OR_DEPARTMENT_OTHER): Payer: Self-pay

## 2024-09-01 DIAGNOSIS — I10 Essential (primary) hypertension: Secondary | ICD-10-CM | POA: Insufficient documentation

## 2024-09-01 DIAGNOSIS — R519 Headache, unspecified: Secondary | ICD-10-CM | POA: Diagnosis present

## 2024-09-01 DIAGNOSIS — R42 Dizziness and giddiness: Secondary | ICD-10-CM | POA: Diagnosis not present

## 2024-09-01 DIAGNOSIS — Z7901 Long term (current) use of anticoagulants: Secondary | ICD-10-CM | POA: Insufficient documentation

## 2024-09-01 DIAGNOSIS — Z79899 Other long term (current) drug therapy: Secondary | ICD-10-CM | POA: Diagnosis not present

## 2024-09-01 LAB — RESP PANEL BY RT-PCR (RSV, FLU A&B, COVID)  RVPGX2
Influenza A by PCR: NEGATIVE
Influenza B by PCR: NEGATIVE
Resp Syncytial Virus by PCR: NEGATIVE
SARS Coronavirus 2 by RT PCR: NEGATIVE

## 2024-09-01 LAB — CBC WITH DIFFERENTIAL/PLATELET
Abs Immature Granulocytes: 0.02 K/uL (ref 0.00–0.07)
Basophils Absolute: 0 K/uL (ref 0.0–0.1)
Basophils Relative: 0 %
Eosinophils Absolute: 0.2 K/uL (ref 0.0–0.5)
Eosinophils Relative: 3 %
HCT: 37.2 % — ABNORMAL LOW (ref 39.0–52.0)
Hemoglobin: 11.8 g/dL — ABNORMAL LOW (ref 13.0–17.0)
Immature Granulocytes: 0 %
Lymphocytes Relative: 19 %
Lymphs Abs: 1.4 K/uL (ref 0.7–4.0)
MCH: 28.6 pg (ref 26.0–34.0)
MCHC: 31.7 g/dL (ref 30.0–36.0)
MCV: 90.1 fL (ref 80.0–100.0)
Monocytes Absolute: 0.4 K/uL (ref 0.1–1.0)
Monocytes Relative: 5 %
Neutro Abs: 5.4 K/uL (ref 1.7–7.7)
Neutrophils Relative %: 73 %
Platelets: 250 K/uL (ref 150–400)
RBC: 4.13 MIL/uL — ABNORMAL LOW (ref 4.22–5.81)
RDW: 13.4 % (ref 11.5–15.5)
WBC: 7.3 K/uL (ref 4.0–10.5)
nRBC: 0 % (ref 0.0–0.2)

## 2024-09-01 LAB — BASIC METABOLIC PANEL WITH GFR
Anion gap: 12 (ref 5–15)
BUN: 14 mg/dL (ref 6–20)
CO2: 24 mmol/L (ref 22–32)
Calcium: 10.1 mg/dL (ref 8.9–10.3)
Chloride: 104 mmol/L (ref 98–111)
Creatinine, Ser: 0.88 mg/dL (ref 0.61–1.24)
GFR, Estimated: >60 mL/min (ref >60)
Glucose, Bld: 103 mg/dL — ABNORMAL HIGH (ref 70–99)
Potassium: 4.1 mmol/L (ref 3.5–5.1)
Sodium: 140 mmol/L (ref 135–145)

## 2024-09-01 LAB — PROTIME-INR
INR: 1.2 (ref 0.8–1.2)
Prothrombin Time: 15.8 s — ABNORMAL HIGH (ref 11.4–15.2)

## 2024-09-01 LAB — TROPONIN T, HIGH SENSITIVITY: Troponin T High Sensitivity: <15 ng/L (ref 0–19)

## 2024-09-01 NOTE — ED Triage Notes (Signed)
 Pt presents via POV c/o headache, nausea, and dizziness that started today. A&O x4. Ambulatory to triage.

## 2024-09-02 ENCOUNTER — Emergency Department (HOSPITAL_BASED_OUTPATIENT_CLINIC_OR_DEPARTMENT_OTHER)

## 2024-09-02 ENCOUNTER — Emergency Department (HOSPITAL_BASED_OUTPATIENT_CLINIC_OR_DEPARTMENT_OTHER)
Admission: EM | Admit: 2024-09-02 | Discharge: 2024-09-02 | Disposition: A | Discharge status: Home / Self Care | Attending: Emergency Medicine | Admitting: Emergency Medicine

## 2024-09-02 DIAGNOSIS — R519 Headache, unspecified: Secondary | ICD-10-CM

## 2024-09-02 DIAGNOSIS — R42 Dizziness and giddiness: Secondary | ICD-10-CM

## 2024-09-02 MED ORDER — IBUPROFEN 800 MG PO TABS
800.0000 mg | ORAL_TABLET | Freq: Once | ORAL | Status: AC
  Administered 2024-09-02: 800 mg via ORAL
  Filled 2024-09-02: qty 1

## 2024-09-02 MED ORDER — MECLIZINE HCL 25 MG PO TABS
25.0000 mg | ORAL_TABLET | Freq: Once | ORAL | Status: AC
  Administered 2024-09-02: 25 mg via ORAL
  Filled 2024-09-02: qty 1

## 2024-09-02 NOTE — Discharge Instructions (Signed)
Take ibuprofen 600 mg every 6 hours as needed for pain.  Rest.  Follow-up with primary doctor if symptoms persist, and return to the ER if symptoms significantly worsen or change. 

## 2024-09-02 NOTE — ED Provider Notes (Signed)
 [place] EMERGENCY DEPARTMENT AT [facility] Provider Note   CSN: [id number] Arrival date & time: 09/01/24  2225     Patient presents with: Headache   Shane Hart is a 58 y.o. male.   Patient is a 58 year old male with history of atrial fibrillation, sleep apnea, obesity, hypertension.  Patient presenting today with complaints of headache.  He describes headache that started earlier this afternoon associated with feeling dizzy and lightheaded.  He denies any weakness or numbness.  No visual disturbances.  No aggravating or alleviating factors.  He has not tried to take anything for his symptoms.       Prior to Admission medications   Medication Sig Start Date End Date Taking? Authorizing Provider  apixaban  (ELIQUIS ) 5 MG TABS tablet Take 1 tablet (5 mg total) by mouth 2 (two) times daily. NEEDS CARDIOLOGY APPT FOR REFILLS.  CALL OFFICE [phone].  THANK YOU 07/21/24   Waddell Danelle ORN, MD  APPLE CIDER VINEGAR PO Take 1 tablet by mouth 2 (two) times daily.    [provider]  Cyanocobalamin (VITAMIN B12) 1000 MCG TBCR Take 1 tablet by mouth daily.    [provider]  diltiazem  (CARDIZEM  CD) 180 MG 24 hr capsule TAKE 1 CAPSULE(180 MG) BY MOUTH DAILY 08/15/24   Waddell Danelle ORN, MD  Ginger, Zingiber officinalis, (GINGER PO) Take 150 mg by mouth daily.     [provider]  metoprolol  tartrate (LOPRESSOR ) 25 MG tablet TAKE 1 TABLET BY MOUTH TWICE DAILY 08/15/24   Waddell Danelle ORN, MD  Multiple Vitamins-Minerals (ONE-A-DAY MENS 50+) TABS Take 1 tablet by mouth daily.    [provider]  multivitamin (ONE-A-DAY MEN'S) TABS tablet Take 1 tablet by mouth daily.    [provider]  Omega-3 Fatty Acids (FISH OIL) 1000 MG CAPS Take 1 tablet by mouth daily.    [provider]    Allergies: Patient has no known allergies.    Review of Systems  All other systems reviewed and are negative.   Updated Vital Signs BP (!) 124/97    Pulse 65   Temp 98.2 F (36.8 C) (Oral)   Resp 16   SpO2 100%   Physical Exam Vitals and nursing note reviewed.  Constitutional:      General: He is not in acute distress.    Appearance: He is well-developed. He is not diaphoretic.  HENT:     Head: Normocephalic and atraumatic.  Eyes:     Extraocular Movements: Extraocular movements intact.     Pupils: Pupils are equal, round, and reactive to light.  Cardiovascular:     Rate and Rhythm: Normal rate and regular rhythm.     Heart sounds: No murmur heard.    No friction rub.  Pulmonary:     Effort: Pulmonary effort is normal. No respiratory distress.     Breath sounds: Normal breath sounds. No wheezing or rales.  Abdominal:     General: Bowel sounds are normal. There is no distension.     Palpations: Abdomen is soft.     Tenderness: There is no abdominal tenderness.  Musculoskeletal:        General: Normal range of motion.     Cervical back: Normal range of motion and neck supple.  Skin:    General: Skin is warm and dry.  Neurological:     Mental Status: He is alert and oriented to person, place, and time. Mental status is at baseline.     Cranial Nerves: No  cranial nerve deficit, dysarthria or facial asymmetry.     Coordination: Coordination normal.     (all labs ordered are listed, but only abnormal results are displayed) Labs Reviewed  CBC WITH DIFFERENTIAL/PLATELET - Abnormal; Notable for the following components:      Result Value   RBC 4.13 (*)    Hemoglobin 11.8 (*)    HCT 37.2 (*)    All other components within normal limits  BASIC METABOLIC PANEL WITH GFR - Abnormal; Notable for the following components:   Glucose, Bld 103 (*)    All other components within normal limits  PROTIME-INR - Abnormal; Notable for the following components:   Prothrombin Time 15.8 (*)    All other components within normal limits  RESP PANEL BY RT-PCR (RSV, FLU A&B, COVID)  RVPGX2  TROPONIN T, HIGH SENSITIVITY  TROPONIN T, HIGH  SENSITIVITY    EKG: None  Radiology: DG Chest 1 View Result Date: 09/01/2024 CLINICAL DATA:  Headache, dizziness, nausea EXAM: DG CHEST 1V COMPARISON:  08/11/2019 FINDINGS: Two frontal views of the chest demonstrate an unremarkable cardiac silhouette. No acute airspace disease, effusion, or pneumothorax. No acute bony abnormalities. IMPRESSION: 1. No acute intrathoracic process. Electronically Signed   By: Ozell Daring M.D.   On: 09/01/2024 23:04     Procedures   Medications Ordered in the ED  ibuprofen  (ADVIL ) tablet 800 mg (has no administration in time range)  meclizine  (ANTIVERT ) tablet 25 mg (has no administration in time range)                                    Medical Decision Making Amount and/or Complexity of Data Reviewed Radiology: ordered.  Risk Prescription drug management.   Patient is a 58 year old male presenting with complaints of dizziness as described in the HPI.  Patient arrives here with stable vital signs and is afebrile.  Physical examination is unremarkable and he is neurologically intact upon presentation.  Laboratory studies obtained including CBC, basic metabolic panel, and troponin, all of which are unremarkable.  Respiratory panel negative for COVID/flu/RSV.  CT scan of the head obtained showing no acute intracranial process.  Patient given meclizine  for dizziness and ibuprofen  for headache and seems to be feeling somewhat better.  At this point, I feel as though patient can safely be discharged.  Symptoms possibly related to vertigo, but doubt CVA or other acute process.     Final diagnoses:  None    ED Discharge Orders     None          Geroldine Berg, MD 09/02/24 0400

## 2024-09-19 ENCOUNTER — Other Ambulatory Visit: Payer: Self-pay | Admitting: Internal Medicine

## 2024-09-19 DIAGNOSIS — I4892 Unspecified atrial flutter: Secondary | ICD-10-CM

## 2024-09-19 NOTE — Telephone Encounter (Signed)
 Pateint has an appointment pending for 10/07/24 with Renee, will send in limited refill at this this.

## 2024-09-19 NOTE — Telephone Encounter (Addendum)
 Eliquis  5mg  refill request received. Patient is 58 years old, weight-170.1kg, Crea-0.88 on 09/01/24, Diagnosis-Afib & Aflutter, and last seen by Dr. Waddell on 06/05/23-needs appt. Dose is appropriate based on dosing criteria.   Message sent to Schedulers regarding pt needing an appointment. Also, called patient to leave a message to call back regarding an appointment.

## 2024-10-05 NOTE — Progress Notes (Unsigned)
 Cardiology Office Note:  .   Date:  10/05/2024  ID:  Shane Hart, DOB [birth year], MRN [id number] PCP: Marylu Leita SAUNDERS, NP (Inactive)  [facility] Providers Cardiologist:  None Electrophysiologist:  Danelle Birmingham, MD {  History of Present Illness: .   Shane Hart is a 58 y.o. male w/PMHx of  Morbid obesity, sleep apnea AFlutter/Afib  He saw Dr. Birmingham 06/05/23, doing well.  Felt his biggest issue was his weight has reverted to atrial fib. No indication for ablation at this point  Had an ER visit 09/02/24 for dizziness/headache, CT negative, labs unremarkable treated with tylenol  and meclizine   Today's visit is scheduled as an annual visit ROS:   He feels very well. 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 Left ventricular ejection fraction, by visual estimation, is 55 to  60%. The left ventricle has normal function. Normal left ventricular size.  There is no left ventricular hypertrophy.   2. Left ventricular diastolic Doppler parameters are indeterminate  pattern of LV diastolic filling.   3. Global right ventricle has normal systolic function.The right  ventricular size is normal. No increase in right ventricular wall  thickness.   4. Left atrial size was mildly dilated.   5. Right atrial size was moderately dilated.   6. The mitral valve is normal in structure. No evidence of mitral valve  regurgitation. No evidence of mitral stenosis.   7. The tricuspid valve is normal in structure. Tricuspid valve  regurgitation is mild.    8. The aortic valve is tricuspid Aortic valve regurgitation was not  visualized by color flow Doppler. Structurally normal aortic valve, with  no evidence of sclerosis or stenosis.   9. The pulmonic valve was normal in structure. Pulmonic valve  regurgitation is not visualized by color flow Doppler.  10. Normal pulmonary artery systolic pressure.  11. The inferior vena cava is normal in size with greater than 50%  respiratory variability, suggesting right atrial pressure of 3 mmHg.  12. Atrial flutter throughout study.     Risk Assessment/Calculations:    Physical Exam:   VS:  There were no vitals taken for this visit.   Wt Readings from Last 3 Encounters:  06/05/23 (!) 375 lb (170.1 kg)  01/24/22 (!) 372 lb 6.4 oz (168.9 kg)  08/19/20 (!) 360 lb (163.3 kg)    GEN: Well nourished, well developed in no acute distress NECK: No JVD; No carotid bruits CARDIAC: irreg-irreg, no murmurs, rubs, gallops RESPIRATORY:  CTA b/l without rales, wheezing or rhonchi  ABDOMEN: Soft, non-tender, non-distended EXTREMITIES: No edema; No deformity    ASSESSMENT AND PLAN: .    AFlutter Longstanding persistent AFib  His last SR EKG was back in 2020 CHA2DS2Vasc is zero, has been maintained on eliquis , appropriately dosed Rate controlled and asymptomatic He confirms today prior disussion of an ablation though he has not wanted to pursue invasive management strategies Prefers a more conservative approach Feels well  Secondary hypercoagulable state 2/2 AFib  Discussed Dr. Adrian upcoming retirement  and need for a new EP MD, he would like to see Dr. Almetta  Dispo: plan to see Dr. Almetta in 6 mo, back sooner if needed  Signed, Shane Macario Arthur, PA-C

## 2024-10-07 ENCOUNTER — Ambulatory Visit: Attending: Physician Assistant | Admitting: Physician Assistant

## 2024-10-07 VITALS — BP 122/90 | HR 78 | Ht 73.0 in | Wt 388.0 lb

## 2024-10-07 DIAGNOSIS — D6869 Other thrombophilia: Secondary | ICD-10-CM | POA: Diagnosis not present

## 2024-10-07 DIAGNOSIS — I4892 Unspecified atrial flutter: Secondary | ICD-10-CM | POA: Diagnosis not present

## 2024-10-07 DIAGNOSIS — I4811 Longstanding persistent atrial fibrillation: Secondary | ICD-10-CM | POA: Diagnosis not present

## 2024-10-07 MED ORDER — APIXABAN 5 MG PO TABS: 5.0000 mg | ORAL_TABLET | Freq: Two times a day (BID) | ORAL | 3 refills | Status: AC

## 2024-10-07 MED ORDER — DILTIAZEM HCL ER COATED BEADS 180 MG PO CP24: 180.0000 mg | ORAL_CAPSULE | Freq: Every day | ORAL | 3 refills | Status: AC

## 2024-10-07 MED ORDER — METOPROLOL TARTRATE 25 MG PO TABS: 25.0000 mg | ORAL_TABLET | Freq: Two times a day (BID) | ORAL | 3 refills | Status: AC

## 2024-10-07 NOTE — Patient Instructions (Signed)
 Medication Instructions:   Your physician recommends that you continue on your current medications as directed. Please refer to the Current Medication list given to you today.  *If you need a refill on your cardiac medications before your next appointment, please call your pharmacy*   Lab Work: NONE ORDERED  TODAY    If you have labs (blood work) drawn today and your tests are completely normal, you will receive your results only by: MyChart Message (if you have MyChart) OR A paper copy in the mail If you have any lab test that is abnormal or we need to change your treatment, we will call you to review the results.  Testing/Procedures: NONE ORDERED  TODAY    Follow-Up: At [facility], you and your health needs are our priority.  As part of our continuing mission to provide you with exceptional heart care, our providers are all part of one team.  This team includes your primary Cardiologist (physician) and Advanced Practice Providers or APPs (Physician Assistants and Nurse Practitioners) who all work together to provide you with the care you need, when you need it.  Your next appointment:   6 month(s)  Provider:   Donnice Primus, MD    We recommend signing up for the patient portal called MyChart.  Sign up information is provided on this After Visit Summary.  MyChart is used to connect with patients for Virtual Visits (Telemedicine).  Patients are able to view lab/test results, encounter notes, upcoming appointments, etc.  Non-urgent messages can be sent to your provider as well.   To learn more about what you can do with MyChart, go to [email or web address].   Other Instructions
